# Patient Record
Sex: Female | Born: 1935 | Race: White | Hispanic: No | Smoking: Never smoker
Health system: Southern US, Community
[De-identification: ages and names within clinical notes are randomized; demographics above are authoritative.]

## PROBLEM LIST (undated history)

## (undated) DIAGNOSIS — G43909 Migraine, unspecified, not intractable, without status migrainosus: Secondary | ICD-10-CM

## (undated) HISTORY — DX: Migraine, unspecified, not intractable, without status migrainosus: G43.909

---

## 1999-06-10 ENCOUNTER — Encounter: Payer: Self-pay | Admitting: Family Medicine

## 1999-06-10 ENCOUNTER — Encounter: Admission: RE | Admit: 1999-06-10 | Discharge: 1999-06-10 | Payer: Self-pay | Admitting: Family Medicine

## 1999-07-02 ENCOUNTER — Encounter: Admission: RE | Admit: 1999-07-02 | Discharge: 1999-07-02 | Payer: Self-pay | Admitting: Family Medicine

## 1999-07-02 ENCOUNTER — Encounter: Payer: Self-pay | Admitting: Family Medicine

## 2000-01-14 ENCOUNTER — Encounter: Admission: RE | Admit: 2000-01-14 | Discharge: 2000-01-14 | Payer: Self-pay | Admitting: Family Medicine

## 2000-01-14 ENCOUNTER — Encounter: Payer: Self-pay | Admitting: Family Medicine

## 2000-09-09 ENCOUNTER — Encounter: Payer: Self-pay | Admitting: Family Medicine

## 2000-09-09 ENCOUNTER — Encounter: Admission: RE | Admit: 2000-09-09 | Discharge: 2000-09-09 | Payer: Self-pay | Admitting: Family Medicine

## 2001-04-04 ENCOUNTER — Encounter: Payer: Self-pay | Admitting: Family Medicine

## 2001-04-04 ENCOUNTER — Encounter: Admission: RE | Admit: 2001-04-04 | Discharge: 2001-04-04 | Payer: Self-pay | Admitting: Family Medicine

## 2002-06-26 ENCOUNTER — Encounter: Payer: Self-pay | Admitting: Family Medicine

## 2002-06-26 ENCOUNTER — Encounter: Admission: RE | Admit: 2002-06-26 | Discharge: 2002-06-26 | Payer: Self-pay | Admitting: Family Medicine

## 2003-07-09 ENCOUNTER — Ambulatory Visit (HOSPITAL_COMMUNITY): Admission: RE | Admit: 2003-07-09 | Discharge: 2003-07-09 | Payer: Self-pay | Admitting: Family Medicine

## 2005-02-03 ENCOUNTER — Ambulatory Visit (HOSPITAL_COMMUNITY): Admission: RE | Admit: 2005-02-03 | Discharge: 2005-02-03 | Payer: Self-pay | Admitting: Family Medicine

## 2006-07-05 ENCOUNTER — Ambulatory Visit (HOSPITAL_COMMUNITY): Admission: RE | Admit: 2006-07-05 | Discharge: 2006-07-05 | Payer: Self-pay | Admitting: Family Medicine

## 2006-12-27 ENCOUNTER — Other Ambulatory Visit: Admission: RE | Admit: 2006-12-27 | Discharge: 2006-12-27 | Payer: Self-pay | Admitting: Family Medicine

## 2007-08-09 ENCOUNTER — Ambulatory Visit (HOSPITAL_COMMUNITY): Admission: RE | Admit: 2007-08-09 | Discharge: 2007-08-09 | Payer: Self-pay | Admitting: Family Medicine

## 2008-08-22 ENCOUNTER — Ambulatory Visit (HOSPITAL_COMMUNITY): Admission: RE | Admit: 2008-08-22 | Discharge: 2008-08-22 | Payer: Self-pay | Admitting: Family Medicine

## 2009-08-27 ENCOUNTER — Ambulatory Visit (HOSPITAL_COMMUNITY): Admission: RE | Admit: 2009-08-27 | Discharge: 2009-08-27 | Payer: Self-pay | Admitting: Family Medicine

## 2010-10-01 ENCOUNTER — Other Ambulatory Visit (HOSPITAL_COMMUNITY): Payer: Self-pay | Admitting: Family Medicine

## 2010-10-01 DIAGNOSIS — Z1231 Encounter for screening mammogram for malignant neoplasm of breast: Secondary | ICD-10-CM

## 2010-10-14 ENCOUNTER — Ambulatory Visit (HOSPITAL_COMMUNITY)
Admission: RE | Admit: 2010-10-14 | Discharge: 2010-10-14 | Disposition: A | Discharge status: Home / Self Care | Payer: PRIVATE HEALTH INSURANCE | Source: Ambulatory Visit | Attending: Family Medicine | Admitting: Family Medicine

## 2010-10-14 DIAGNOSIS — Z1231 Encounter for screening mammogram for malignant neoplasm of breast: Secondary | ICD-10-CM | POA: Insufficient documentation

## 2011-11-16 ENCOUNTER — Other Ambulatory Visit (HOSPITAL_COMMUNITY): Payer: Self-pay | Admitting: Family Medicine

## 2011-11-16 DIAGNOSIS — Z1231 Encounter for screening mammogram for malignant neoplasm of breast: Secondary | ICD-10-CM

## 2011-12-01 ENCOUNTER — Ambulatory Visit (HOSPITAL_COMMUNITY)
Admission: RE | Admit: 2011-12-01 | Discharge: 2011-12-01 | Disposition: A | Discharge status: Home / Self Care | Payer: Medicare Other | Source: Ambulatory Visit | Attending: Family Medicine | Admitting: Family Medicine

## 2011-12-01 DIAGNOSIS — Z1231 Encounter for screening mammogram for malignant neoplasm of breast: Secondary | ICD-10-CM | POA: Insufficient documentation

## 2011-12-01 IMAGING — MG MM DIGITAL SCREENING BILAT
5 series · 5 of 5 positions shown · non-contrast
Comparison: Previous exams.

CLINICAL DATA: Screening.

DIGITAL BILATERAL SCREENING MAMMOGRAM WITH CAD

[R CC]
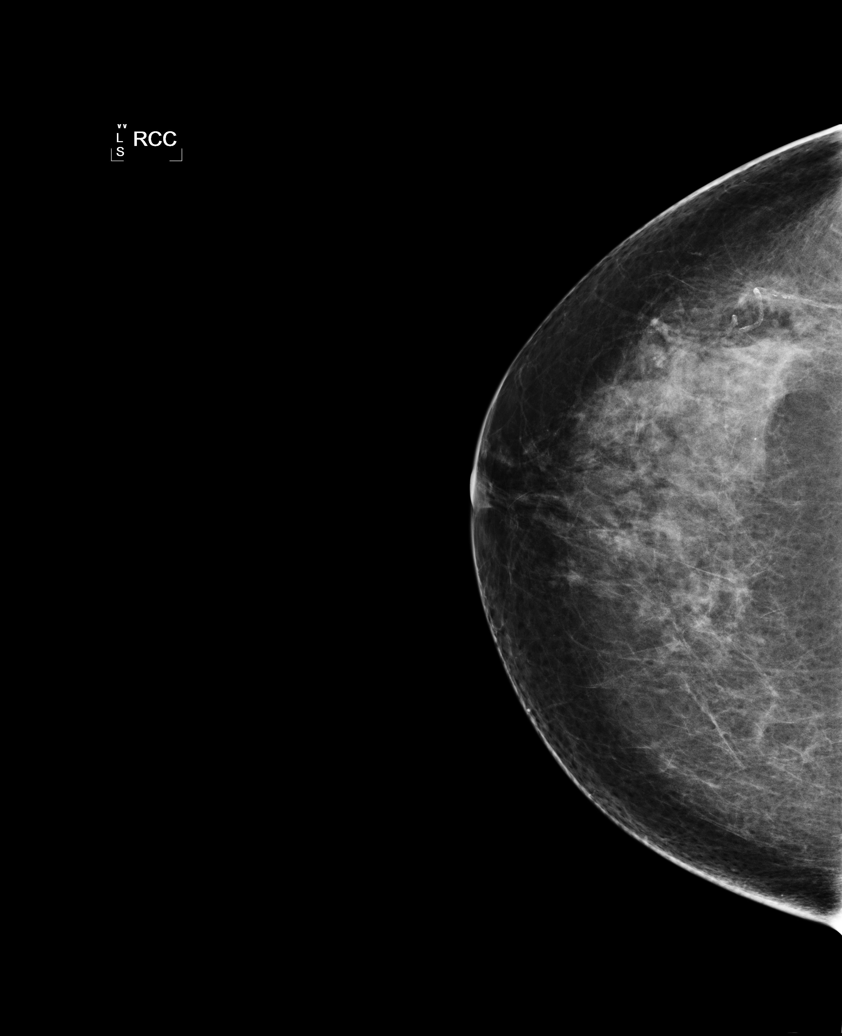

[R MLO (1 of 2)]
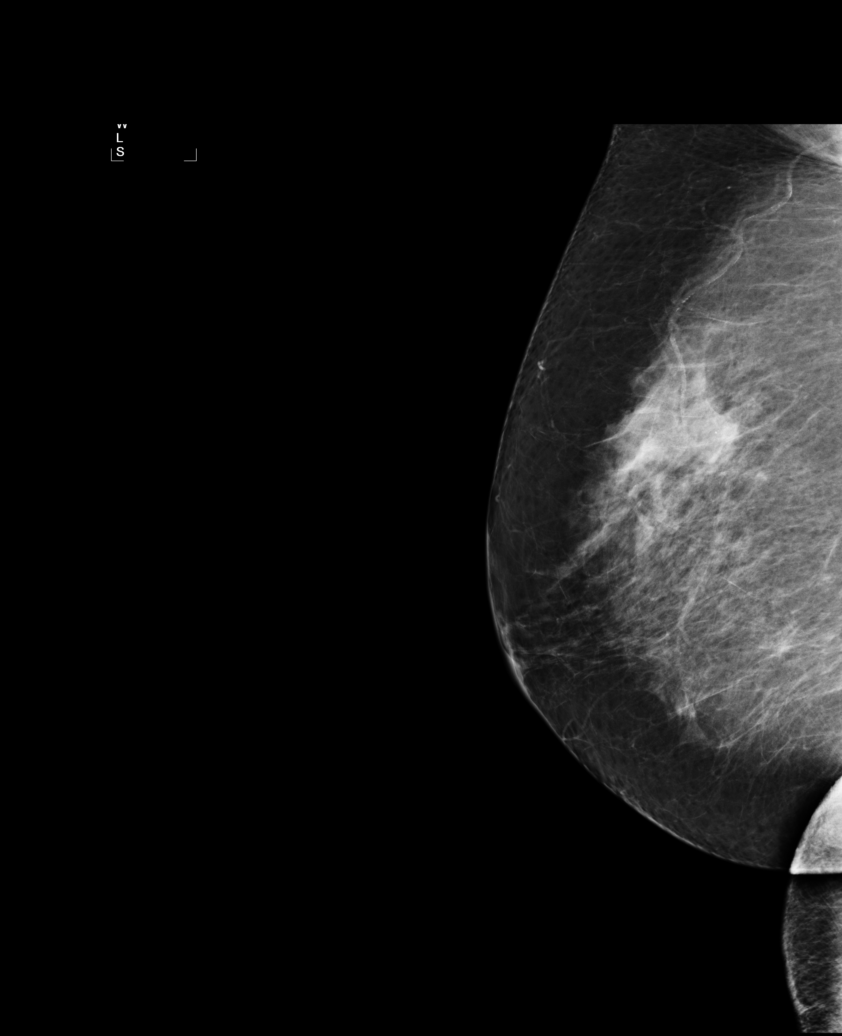

[L CC]
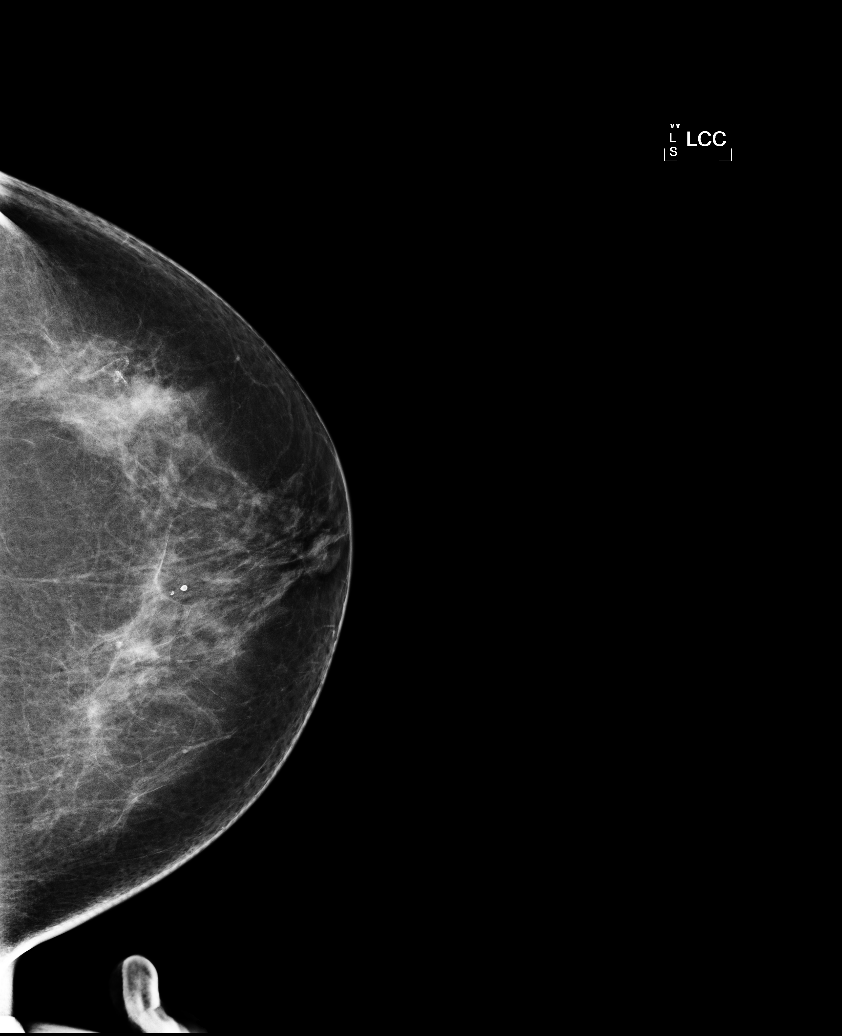

[L MLO]
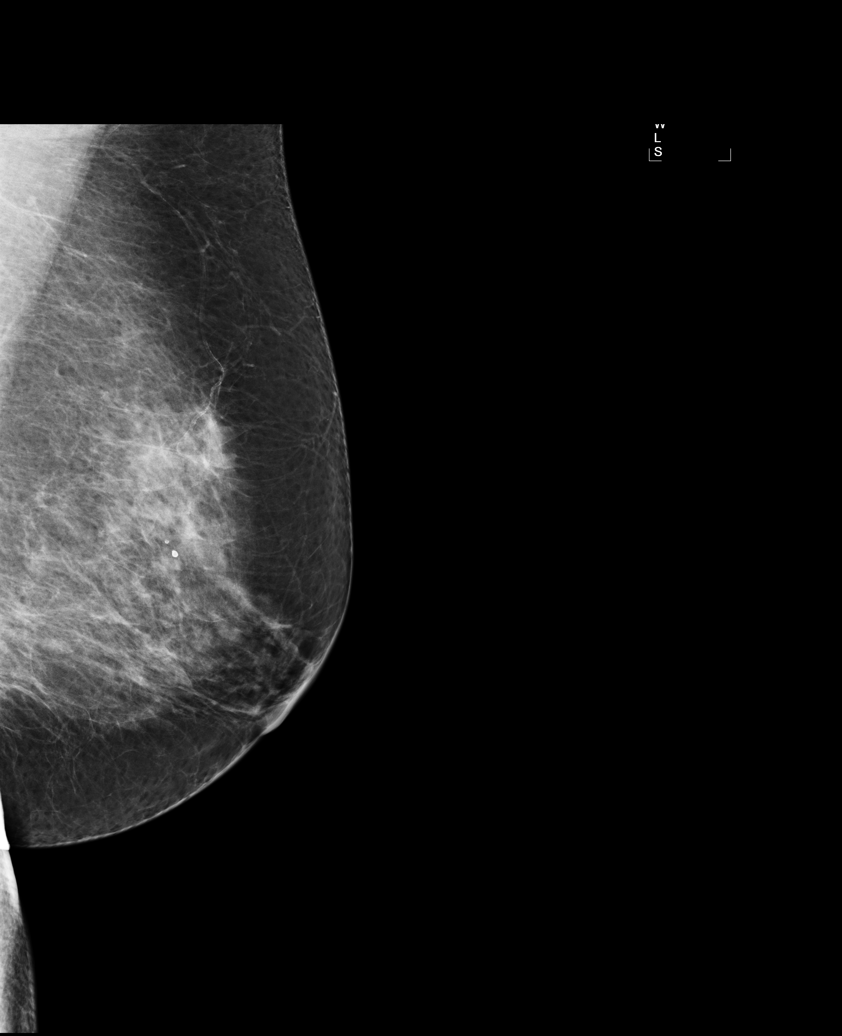

[R MLO (2 of 2)]
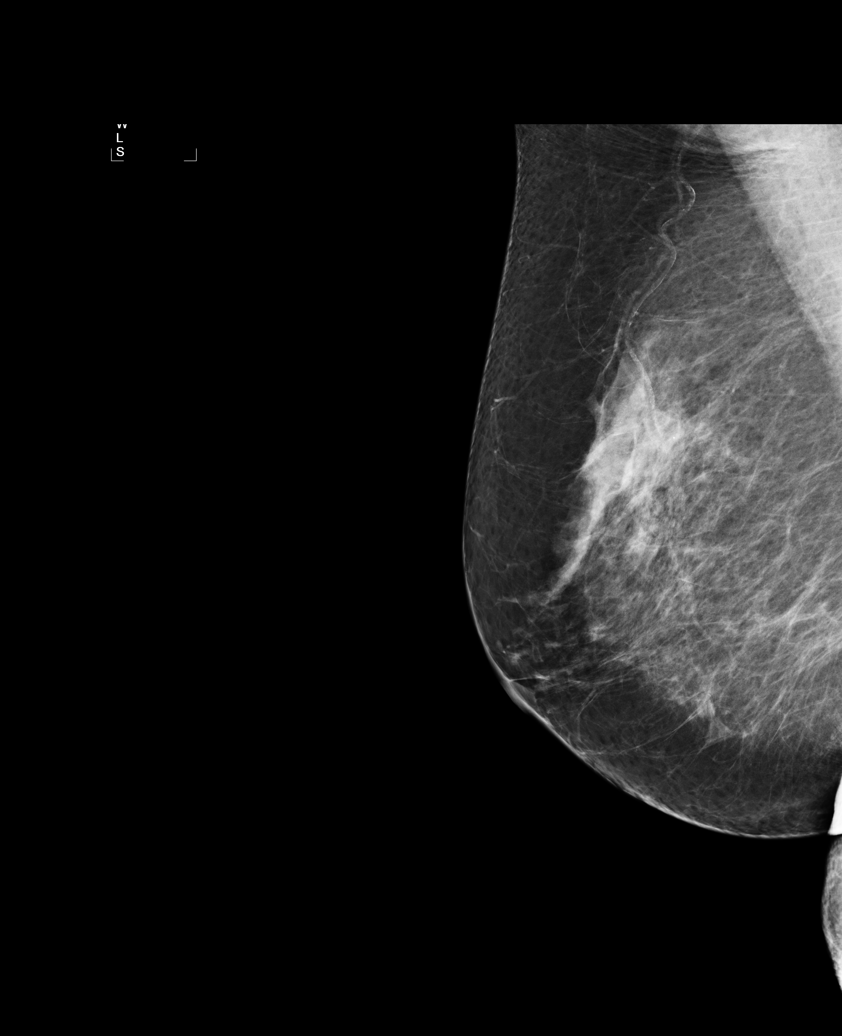

[5 of 5 positions shown; findings below may reference images not displayed]

FINDINGS: There are scattered fibroglandular densities. No
suspicious masses, architectural distortion, or calcifications are
present.

Images were processed with CAD.
IMPRESSION: No mammographic evidence of malignancy.

A result letter of this screening mammogram will be mailed directly
to the patient.

RECOMMENDATION:
Screening mammogram in one year. (Code:[XG])

BI-RADS CATEGORY 1:  Negative.

## 2012-09-01 ENCOUNTER — Ambulatory Visit: Payer: Medicare Other | Attending: Family Medicine | Admitting: Rehabilitative and Restorative Service Providers"

## 2012-09-01 DIAGNOSIS — R42 Dizziness and giddiness: Secondary | ICD-10-CM | POA: Insufficient documentation

## 2012-09-01 DIAGNOSIS — IMO0001 Reserved for inherently not codable concepts without codable children: Secondary | ICD-10-CM | POA: Insufficient documentation

## 2012-09-01 DIAGNOSIS — R269 Unspecified abnormalities of gait and mobility: Secondary | ICD-10-CM | POA: Insufficient documentation

## 2012-09-06 ENCOUNTER — Ambulatory Visit: Payer: Medicare Other | Attending: Family Medicine | Admitting: Rehabilitative and Restorative Service Providers"

## 2012-09-06 DIAGNOSIS — R269 Unspecified abnormalities of gait and mobility: Secondary | ICD-10-CM | POA: Insufficient documentation

## 2012-09-06 DIAGNOSIS — IMO0001 Reserved for inherently not codable concepts without codable children: Secondary | ICD-10-CM | POA: Insufficient documentation

## 2012-09-06 DIAGNOSIS — R42 Dizziness and giddiness: Secondary | ICD-10-CM | POA: Insufficient documentation

## 2012-09-13 ENCOUNTER — Encounter: Payer: PRIVATE HEALTH INSURANCE | Admitting: Rehabilitative and Restorative Service Providers"

## 2013-03-21 ENCOUNTER — Other Ambulatory Visit (HOSPITAL_COMMUNITY): Payer: Self-pay | Admitting: Family Medicine

## 2013-03-21 DIAGNOSIS — Z1231 Encounter for screening mammogram for malignant neoplasm of breast: Secondary | ICD-10-CM

## 2013-03-22 ENCOUNTER — Ambulatory Visit (HOSPITAL_COMMUNITY)
Admission: RE | Admit: 2013-03-22 | Discharge: 2013-03-22 | Disposition: A | Discharge status: Home / Self Care | Payer: Medicare Other | Source: Ambulatory Visit | Attending: Family Medicine | Admitting: Family Medicine

## 2013-03-22 DIAGNOSIS — Z1231 Encounter for screening mammogram for malignant neoplasm of breast: Secondary | ICD-10-CM | POA: Insufficient documentation

## 2013-03-22 IMAGING — MG MM DIGITAL BREAST 3D TOMOSYNTHESIS
9 series · 9 of 25 positions shown · non-contrast
Comparison: Previous exam(s).

CLINICAL DATA: Screening.

EXAM:
DIGITAL SCREENING BILATERAL MAMMOGRAM WITH 3D TOMO WITH CAD

[R MLO (1 of 2)]
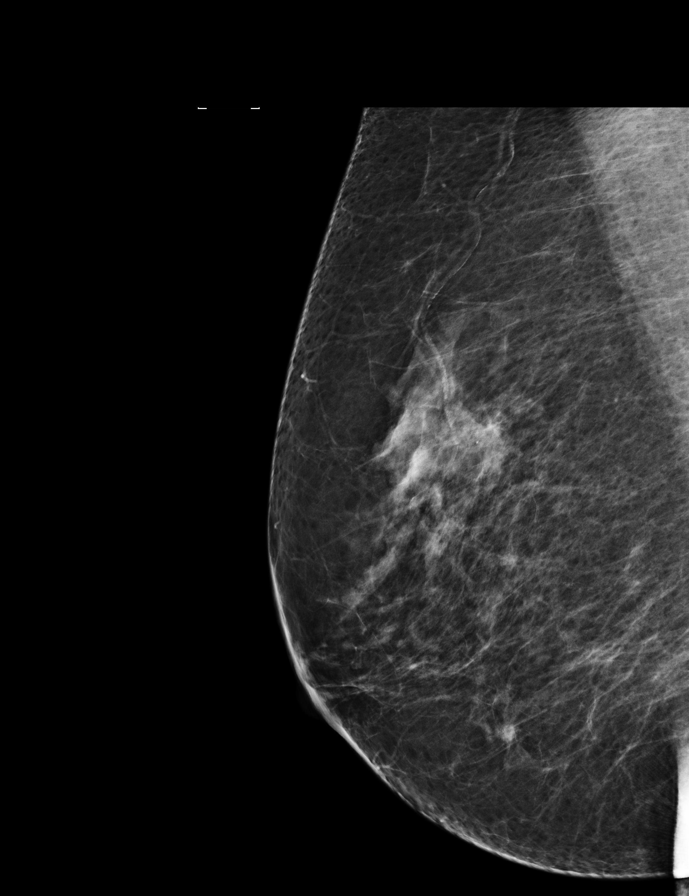

[L CC]
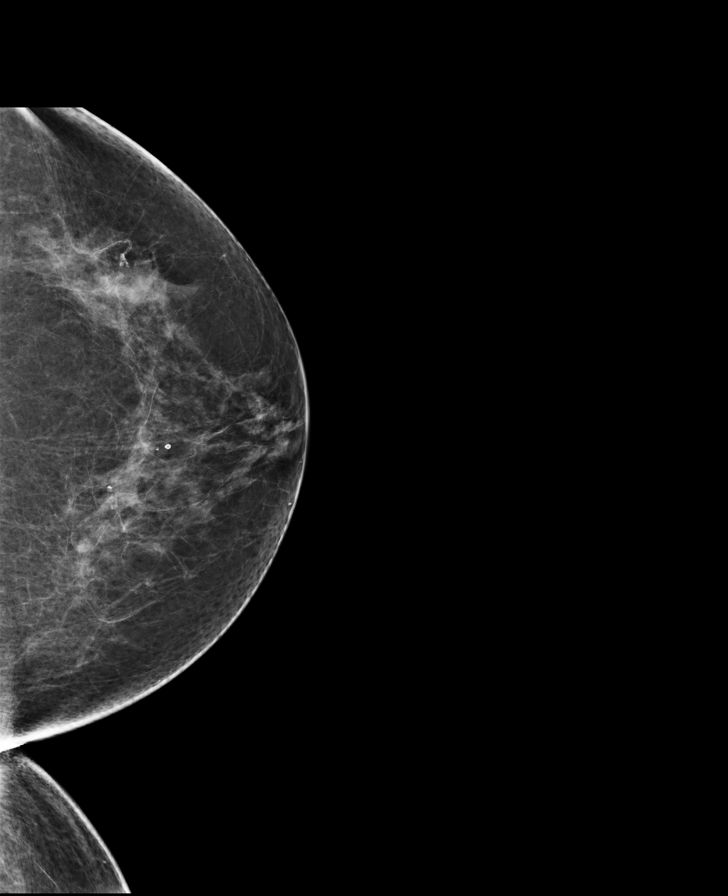

[R CC]
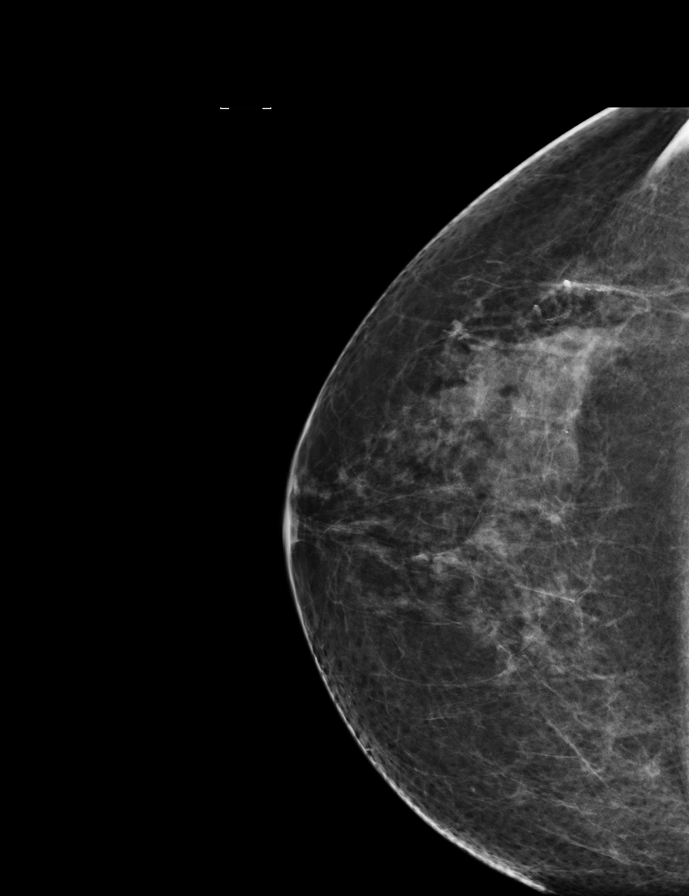

[L MLO]
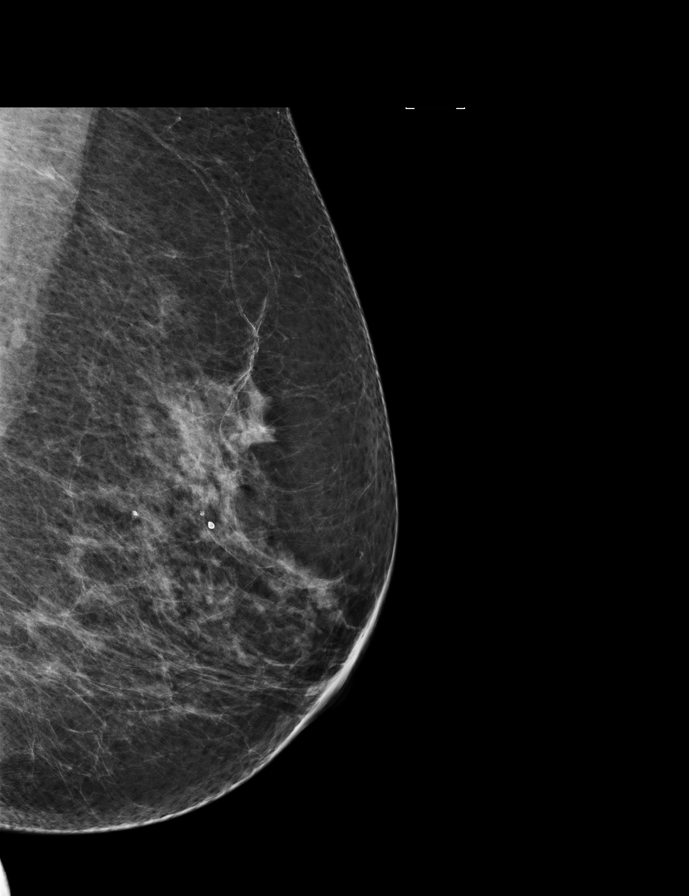

[R MLO (2 of 2)]
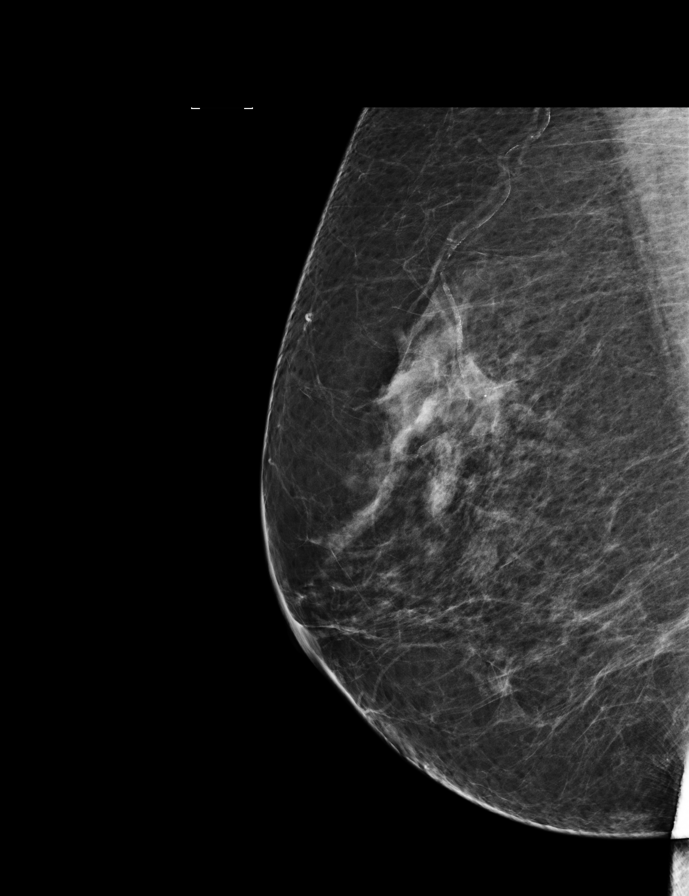

[L CC tomo · tomo slice 46/91.0]
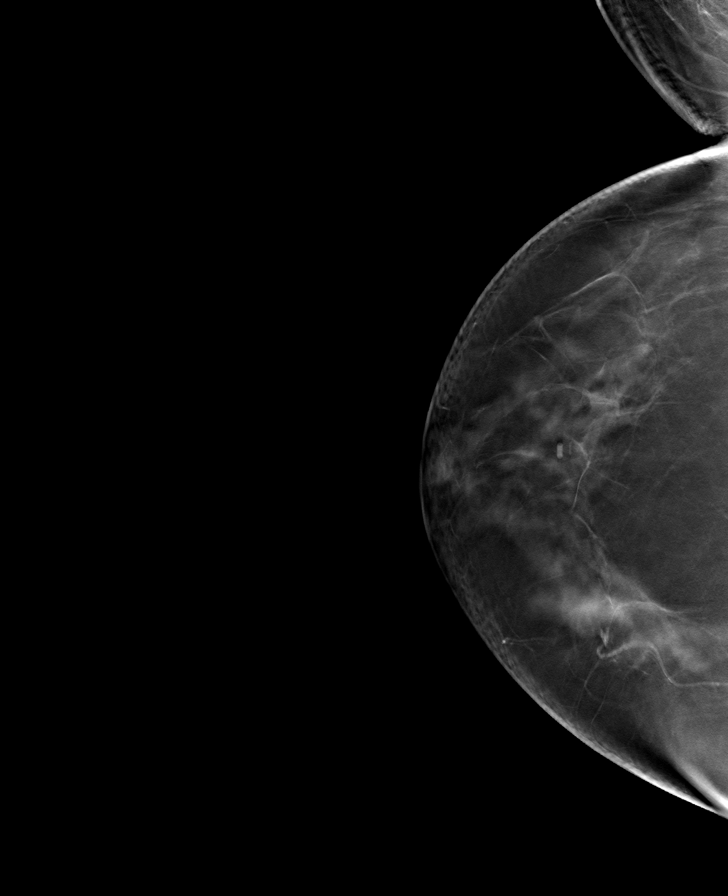

[R CC tomo · tomo slice 47/93.0]
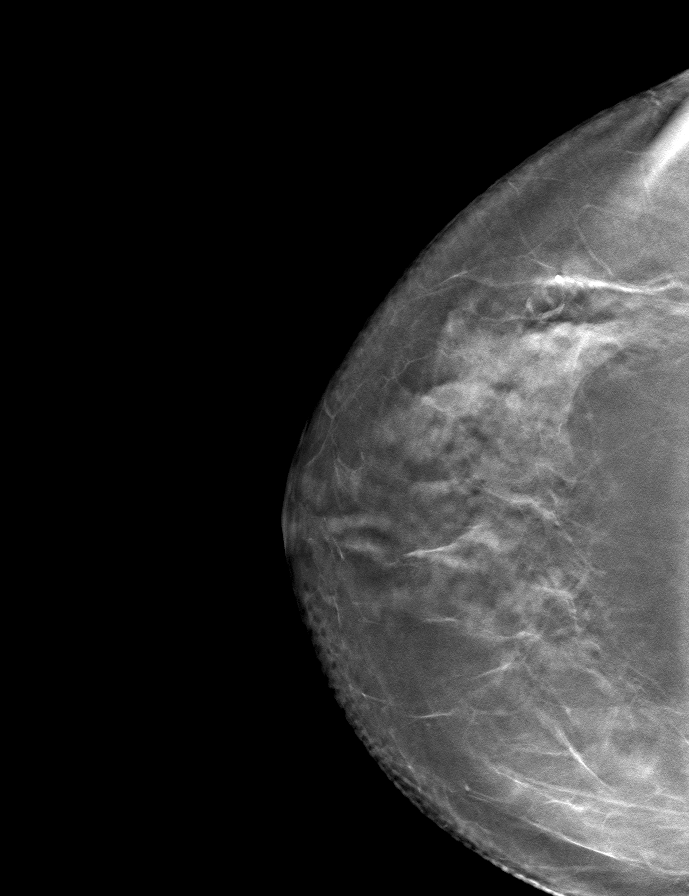

[R MLO tomo · tomo slice 46/91.0]
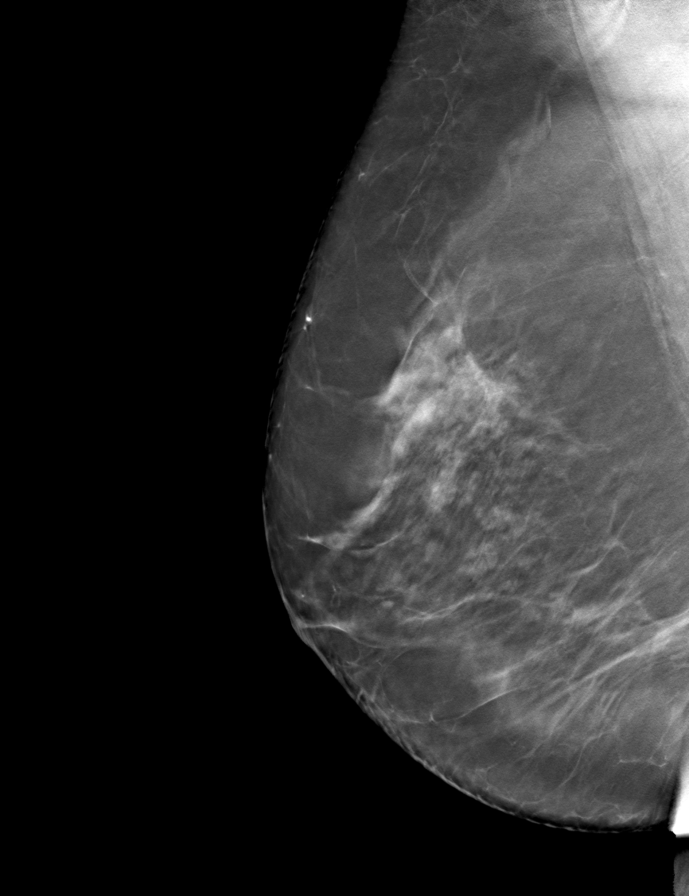

[L MLO tomo · tomo slice 45/88.0]
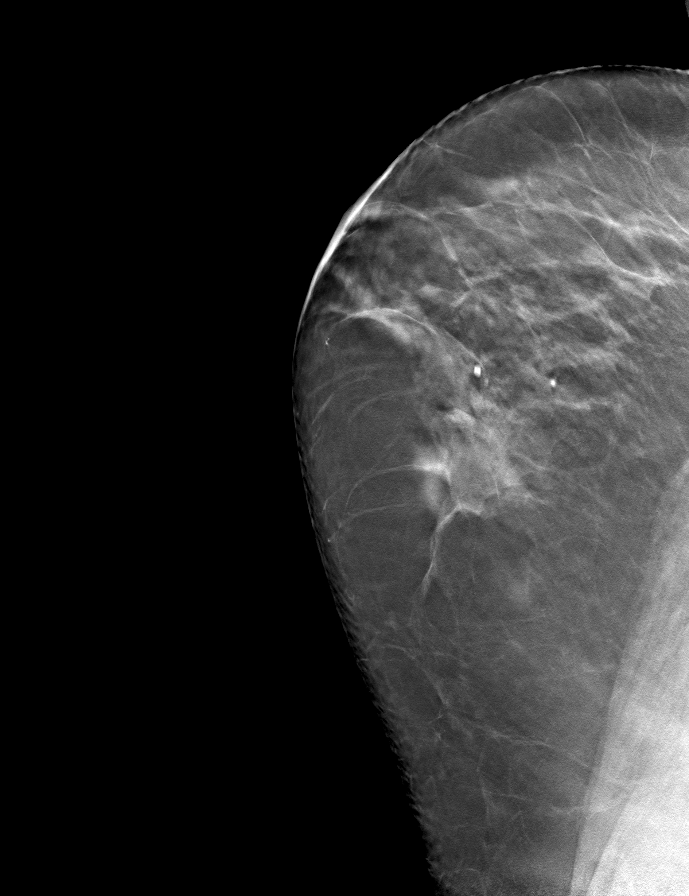

[9 of 25 positions shown; findings below may reference images not displayed]

ACR Breast Density Category b: There are scattered areas of
fibroglandular density.
FINDINGS: There are no findings suspicious for malignancy. Images were
processed with CAD.
IMPRESSION: No mammographic evidence of malignancy. A result letter of this
screening mammogram will be mailed directly to the patient.

RECOMMENDATION:
Screening mammogram in one year. (Code:[8N])

BI-RADS CATEGORY  1: Negative

## 2014-06-04 ENCOUNTER — Other Ambulatory Visit (HOSPITAL_COMMUNITY): Payer: Self-pay | Admitting: Family Medicine

## 2014-06-04 DIAGNOSIS — Z1231 Encounter for screening mammogram for malignant neoplasm of breast: Secondary | ICD-10-CM

## 2014-06-12 ENCOUNTER — Ambulatory Visit (HOSPITAL_COMMUNITY): Payer: PRIVATE HEALTH INSURANCE

## 2014-06-14 ENCOUNTER — Ambulatory Visit (HOSPITAL_COMMUNITY)
Admission: RE | Admit: 2014-06-14 | Discharge: 2014-06-14 | Disposition: A | Discharge status: Home / Self Care | Payer: Medicare Other | Source: Ambulatory Visit | Attending: Family Medicine | Admitting: Family Medicine

## 2014-06-14 DIAGNOSIS — Z1231 Encounter for screening mammogram for malignant neoplasm of breast: Secondary | ICD-10-CM | POA: Insufficient documentation

## 2014-06-14 IMAGING — MG MM SCREENING BREAST TOMO BILATERAL
8 series · 8 of 24 positions shown · non-contrast
Comparison: Previous exam(s).

CLINICAL DATA: Screening.

EXAM:
DIGITAL SCREENING BILATERAL MAMMOGRAM WITH 3D TOMO WITH CAD

[R CC]
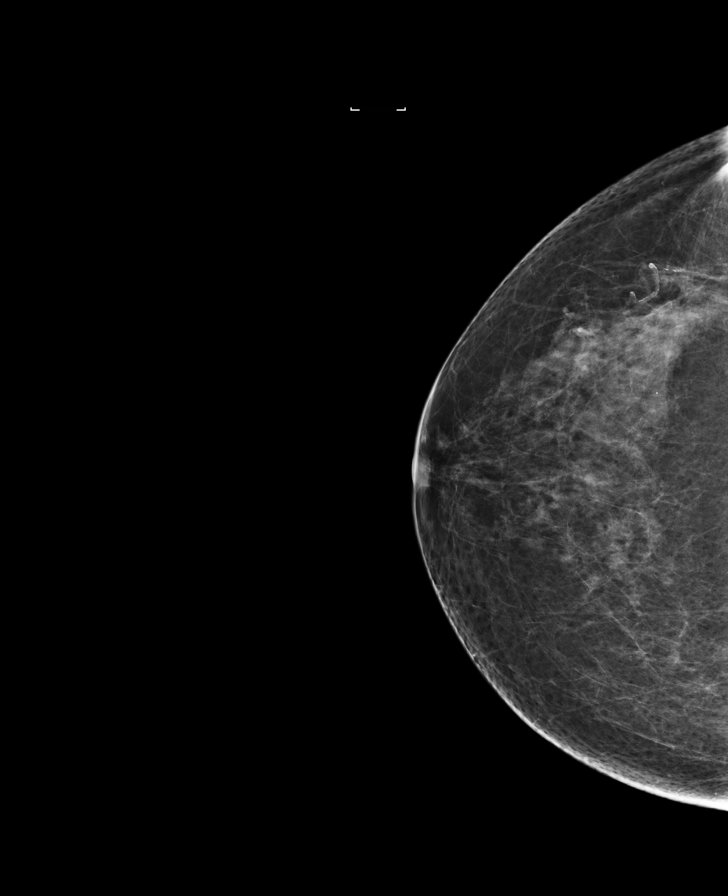

[R MLO]
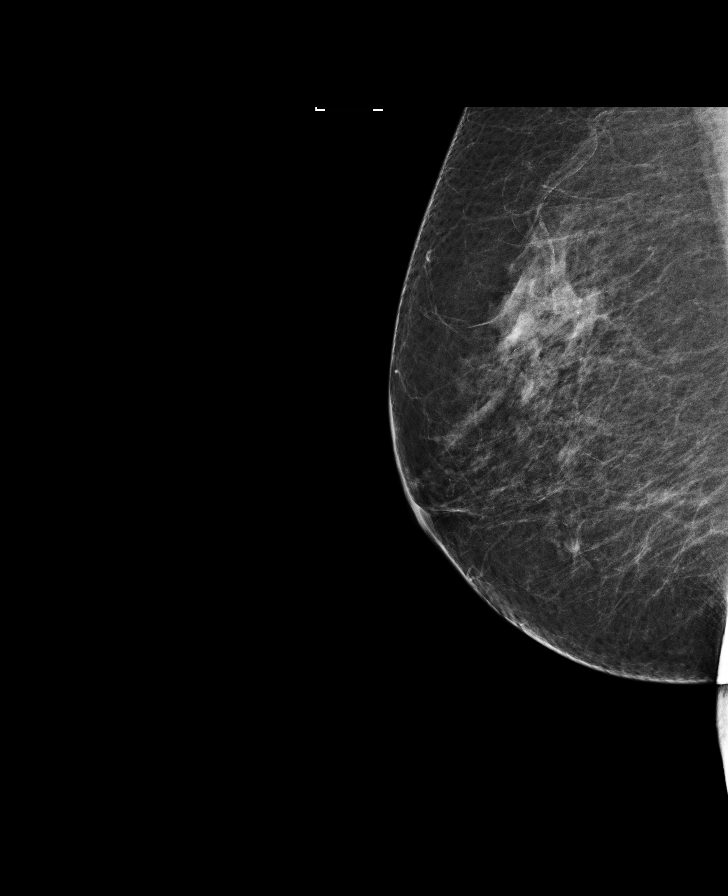

[L CC]
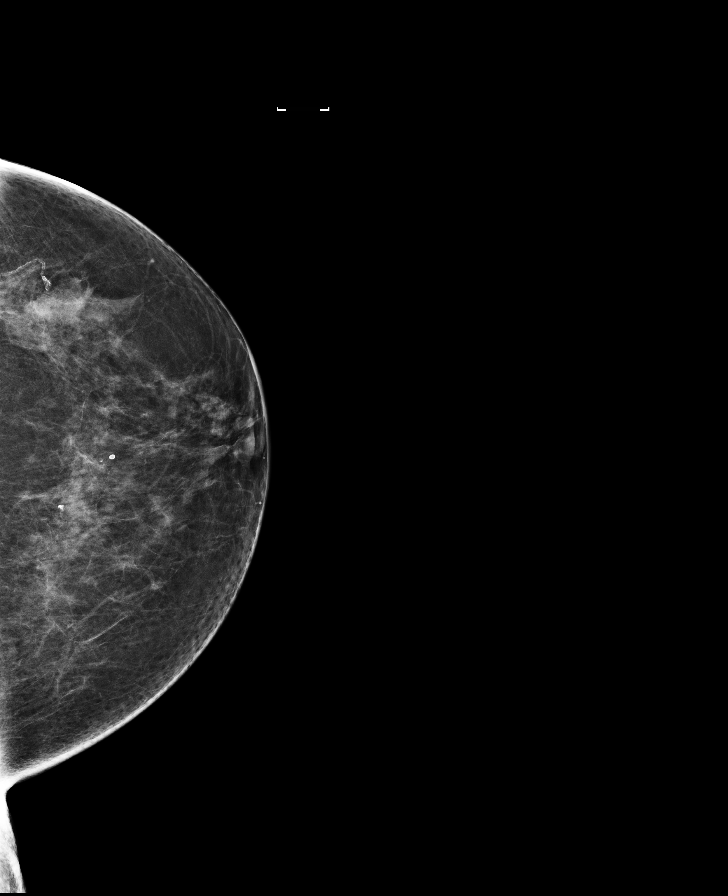

[L MLO]
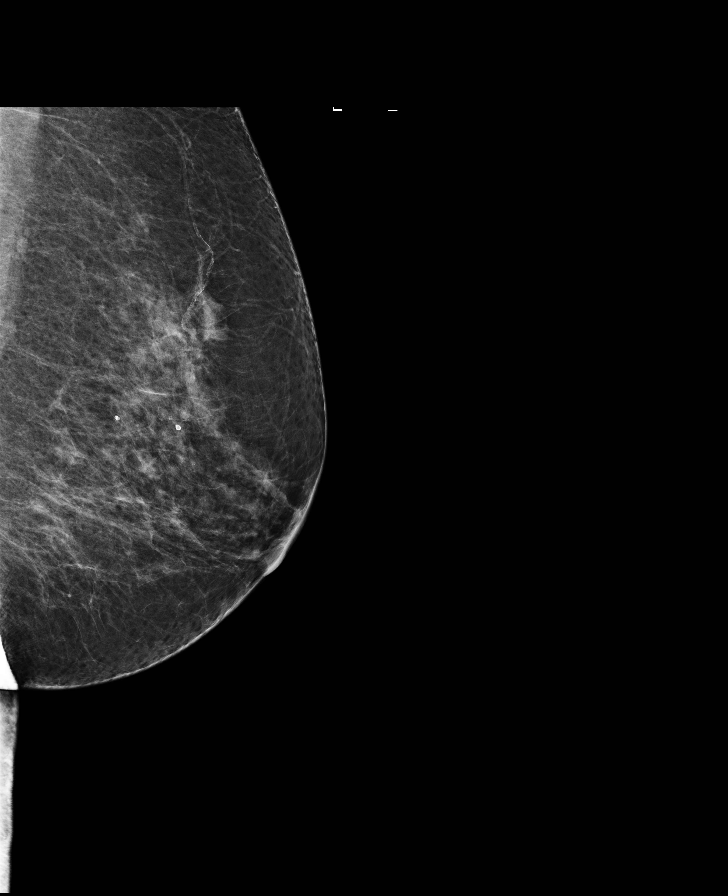

[R MLO tomo · tomo slice 49/96.0]
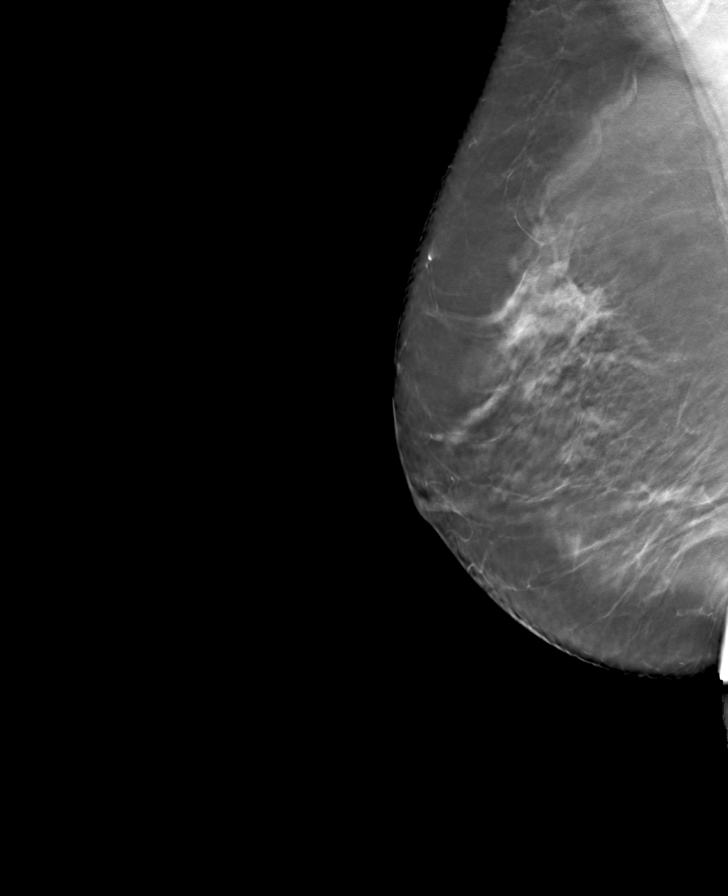

[L MLO tomo · tomo slice 47/93.0]
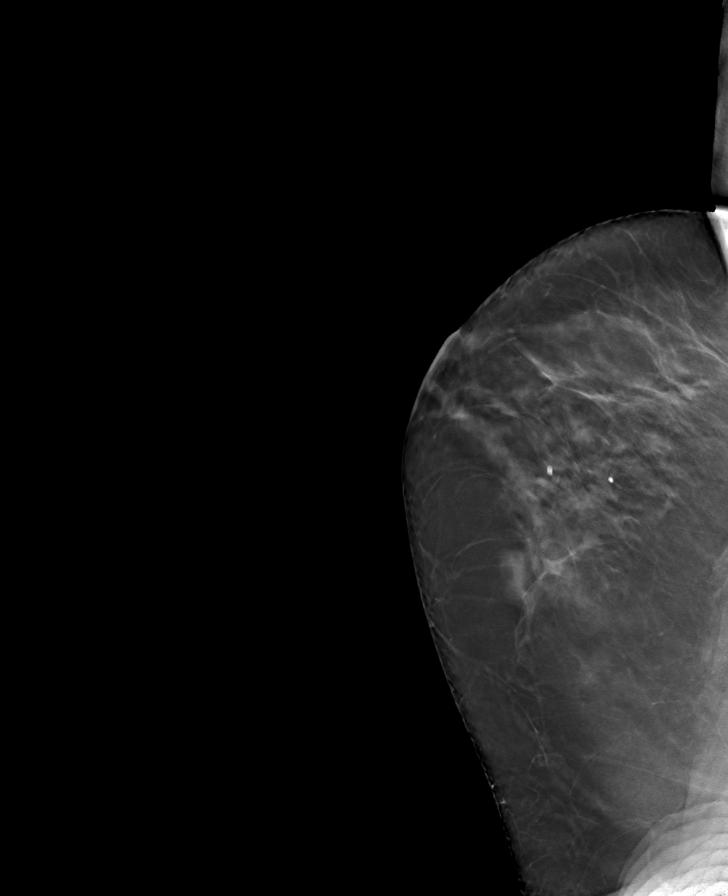

[L CC tomo · tomo slice 43/85.0]
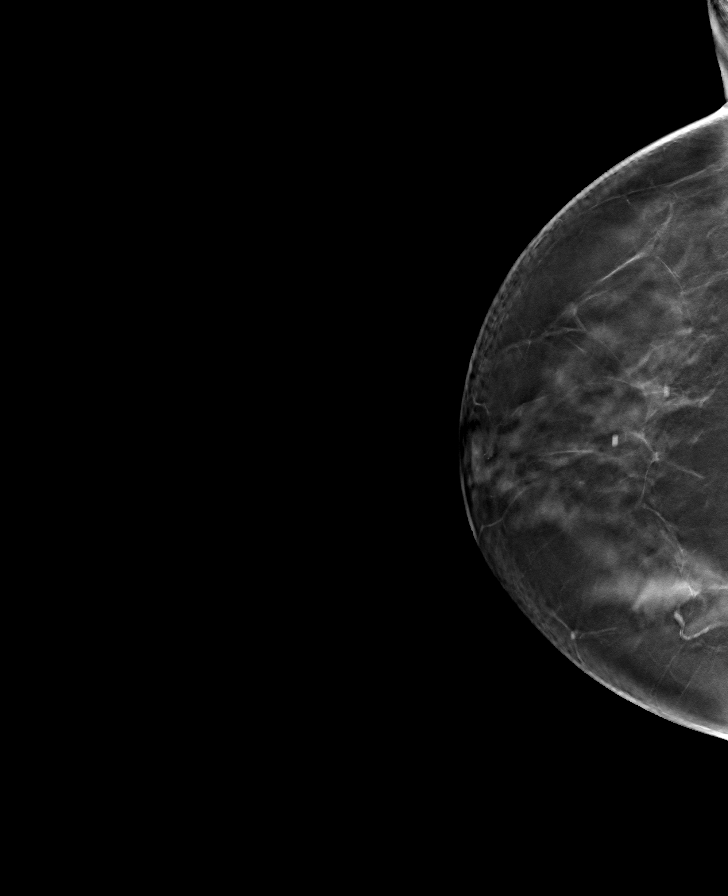

[R CC tomo · tomo slice 47/93.0]
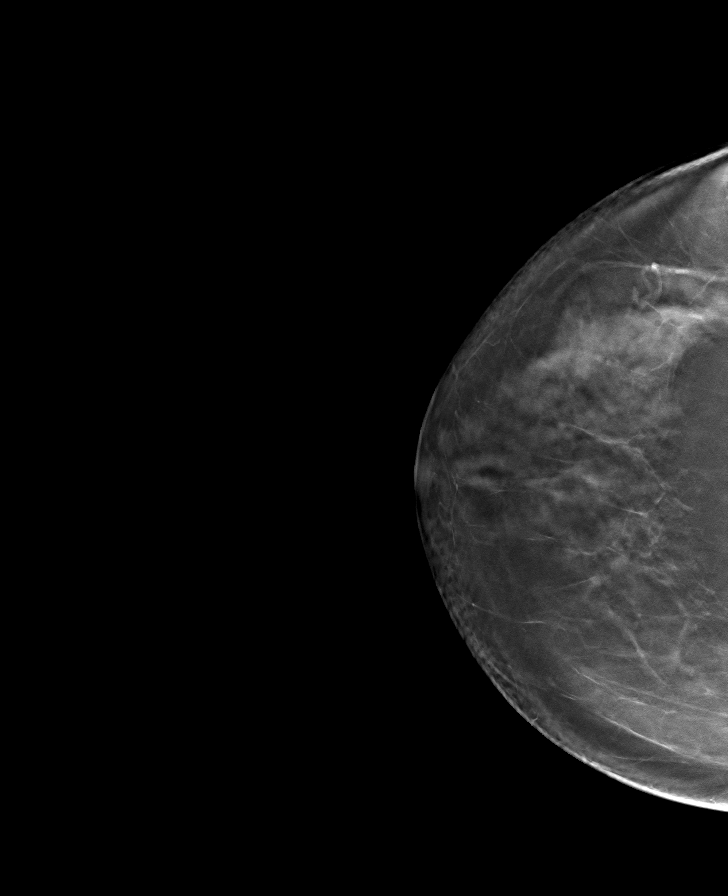

[8 of 24 positions shown; findings below may reference images not displayed]

ACR Breast Density Category c: The breast tissue is heterogeneously
dense, which may obscure small masses.
FINDINGS: There are no findings suspicious for malignancy. Images were
processed with CAD.
IMPRESSION: No mammographic evidence of malignancy. A result letter of this
screening mammogram will be mailed directly to the patient.

RECOMMENDATION:
Screening mammogram in one year. (Code:[SM])

BI-RADS CATEGORY  1: Negative.

## 2016-01-02 ENCOUNTER — Other Ambulatory Visit: Payer: Self-pay | Admitting: Family Medicine

## 2016-01-02 DIAGNOSIS — Z1231 Encounter for screening mammogram for malignant neoplasm of breast: Secondary | ICD-10-CM

## 2016-01-14 ENCOUNTER — Ambulatory Visit
Admission: RE | Admit: 2016-01-14 | Discharge: 2016-01-14 | Disposition: A | Discharge status: Home / Self Care | Payer: Medicare Other | Source: Ambulatory Visit | Attending: Family Medicine | Admitting: Family Medicine

## 2016-01-14 DIAGNOSIS — Z1231 Encounter for screening mammogram for malignant neoplasm of breast: Secondary | ICD-10-CM

## 2016-01-14 IMAGING — MG 2D DIGITAL SCREENING BILATERAL MAMMOGRAM WITH CAD AND ADJUNCT TO
9 of 11 series · 9 of 23 positions shown · non-contrast
Comparison: Previous exam(s).

CLINICAL DATA: Screening.

EXAM:
2D DIGITAL SCREENING BILATERAL MAMMOGRAM WITH CAD AND ADJUNCT TOMO

[R MLO]
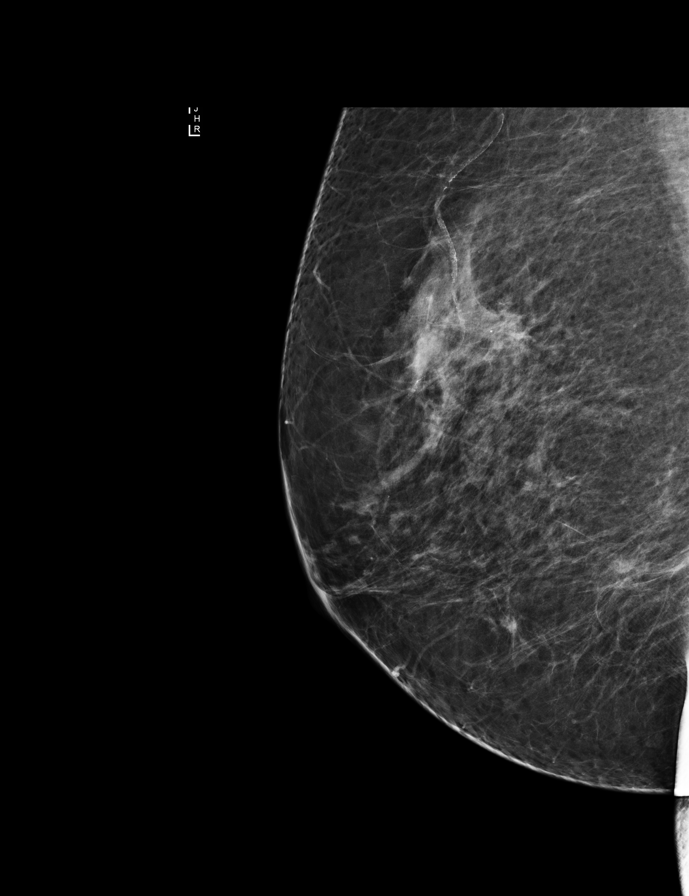

[L MLO synth-2D]
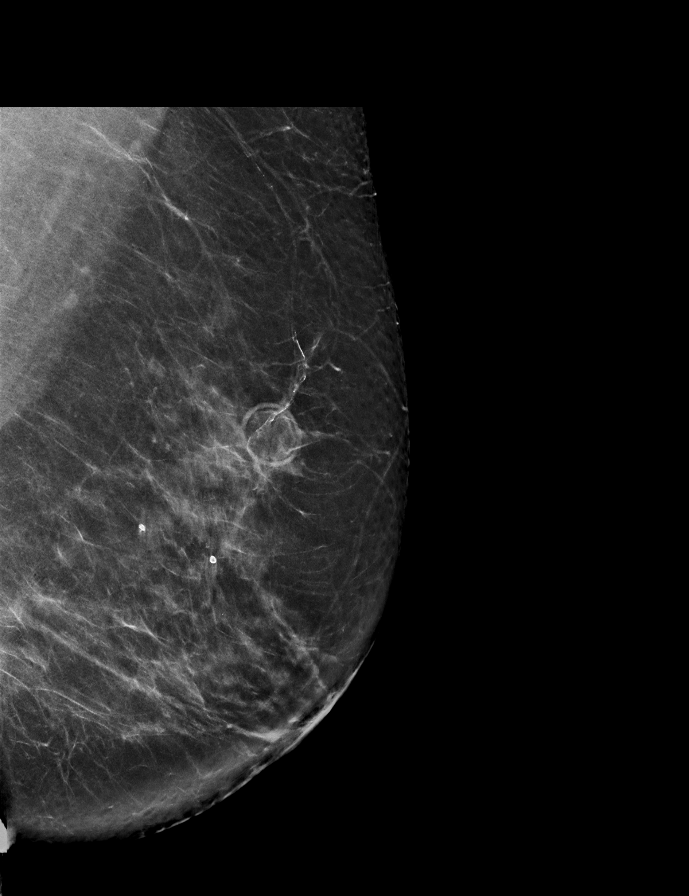

[L CC synth-2D]
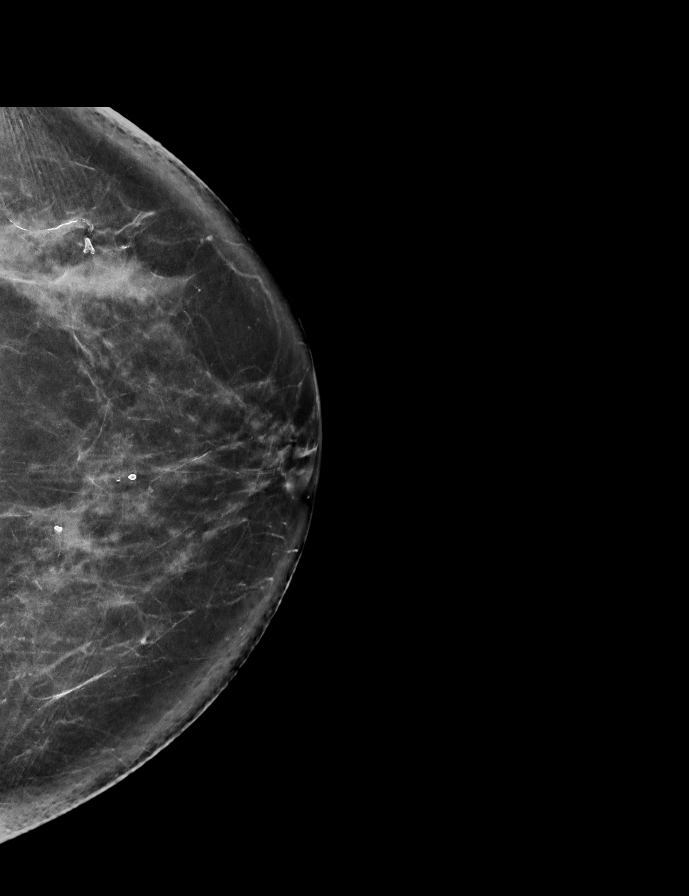

[R MLO synth-2D]
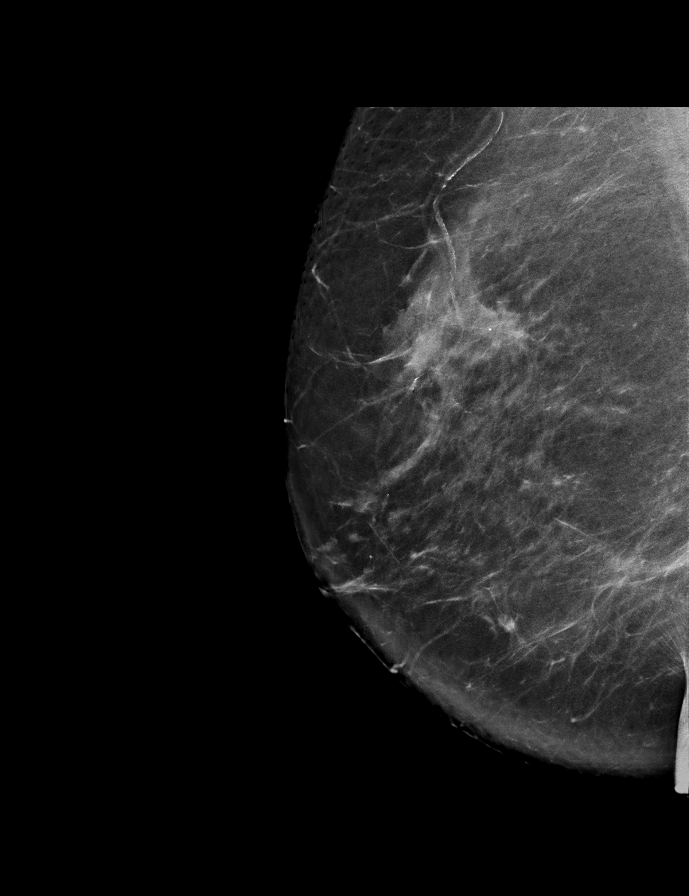

[R CC]
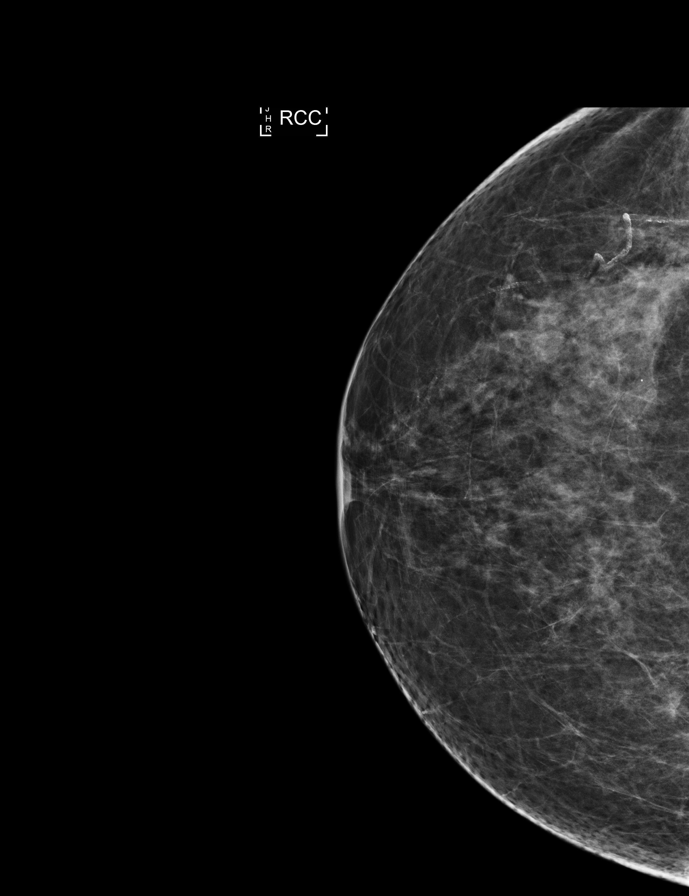

[L MLO]
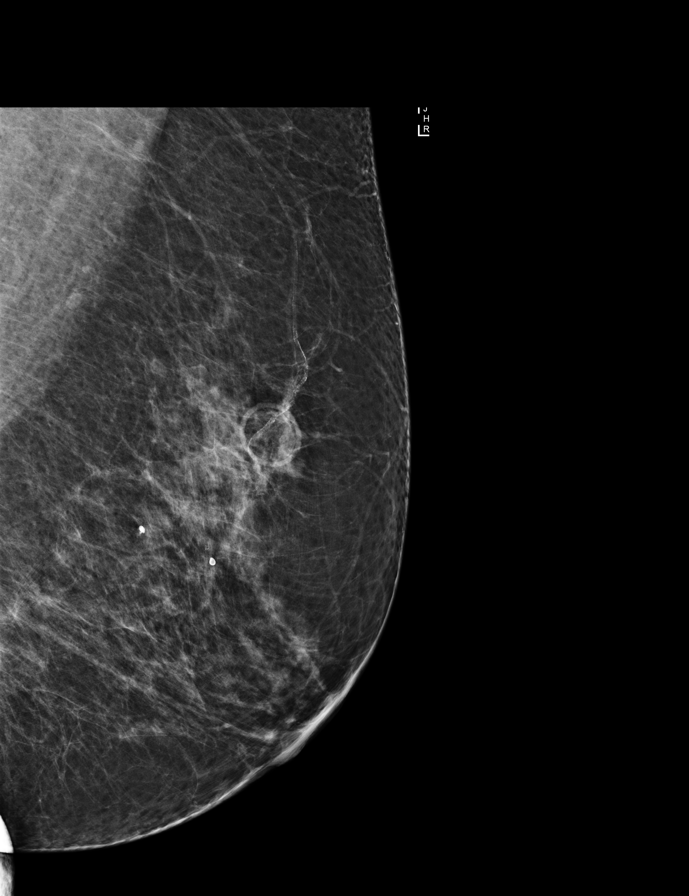

[L CC]
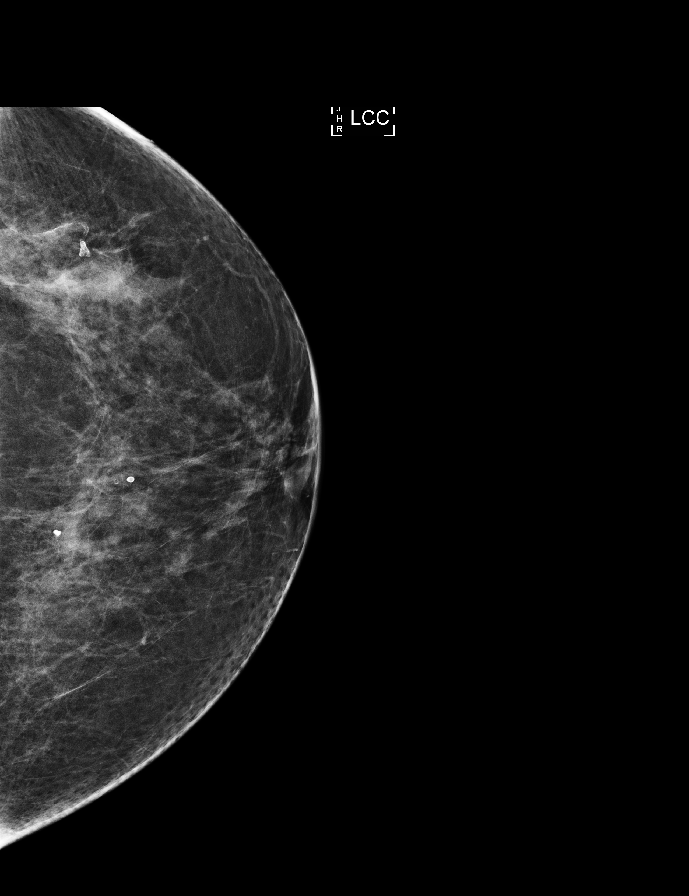

[R CC synth-2D]
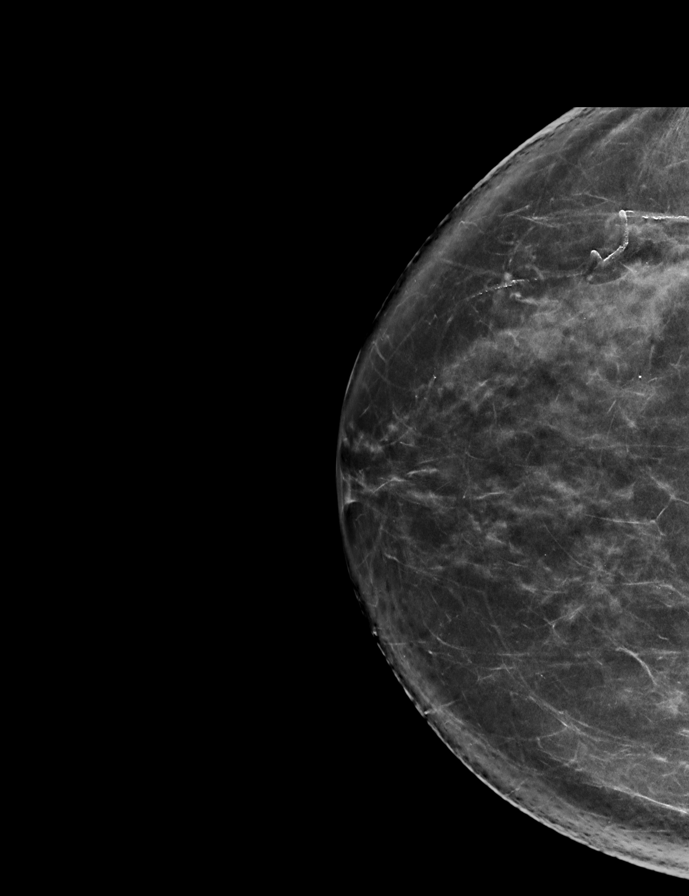

[L MLO tomo · tomo slice 44/87.0]
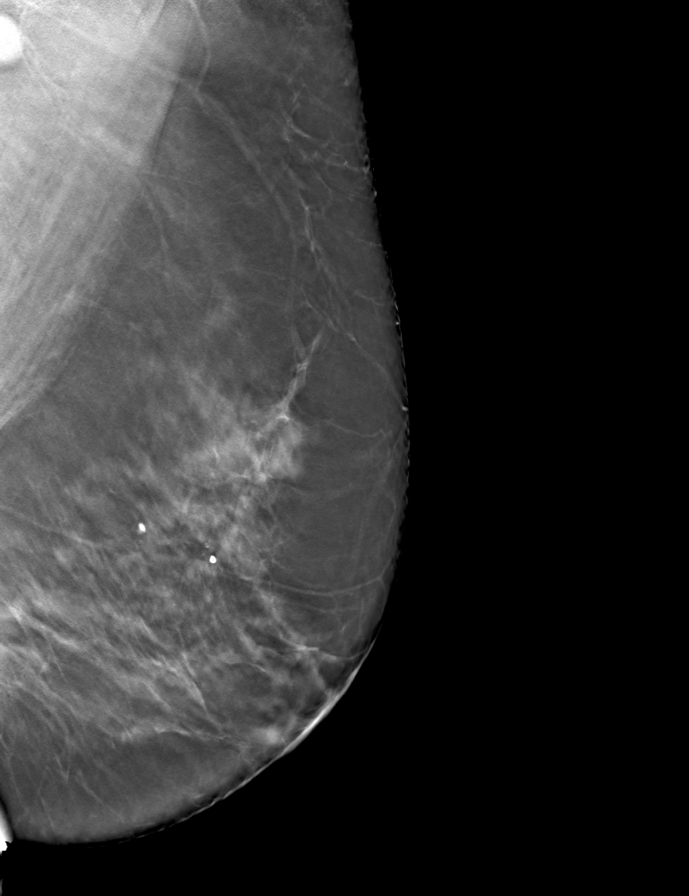

[9 of 23 positions shown; findings below may reference images not displayed]

ACR Breast Density Category c: The breast tissue is heterogeneously
dense, which may obscure small masses.
FINDINGS: There are no findings suspicious for malignancy. Images were
processed with CAD.
IMPRESSION: No mammographic evidence of malignancy. A result letter of this
screening mammogram will be mailed directly to the patient.

RECOMMENDATION:
Screening mammogram in one year. (Code:[TA])

BI-RADS CATEGORY  1: Negative.

## 2016-03-19 ENCOUNTER — Ambulatory Visit
Admission: RE | Admit: 2016-03-19 | Discharge: 2016-03-19 | Disposition: A | Discharge status: Home / Self Care | Payer: Medicare Other | Source: Ambulatory Visit | Attending: Family Medicine | Admitting: Family Medicine

## 2016-03-19 ENCOUNTER — Other Ambulatory Visit: Payer: Self-pay | Admitting: Family Medicine

## 2016-03-19 DIAGNOSIS — J069 Acute upper respiratory infection, unspecified: Secondary | ICD-10-CM | POA: Diagnosis not present

## 2016-03-19 DIAGNOSIS — R05 Cough: Secondary | ICD-10-CM

## 2016-03-19 DIAGNOSIS — R059 Cough, unspecified: Secondary | ICD-10-CM

## 2016-03-19 IMAGING — CR DG CHEST 2V
2 series · 2 of 2 positions shown · non-contrast
Comparison: None.

CLINICAL DATA: Cough with congestion for 5 days, mild shortness of
breath

EXAM:
CHEST  2 VIEW

[w chest pa]
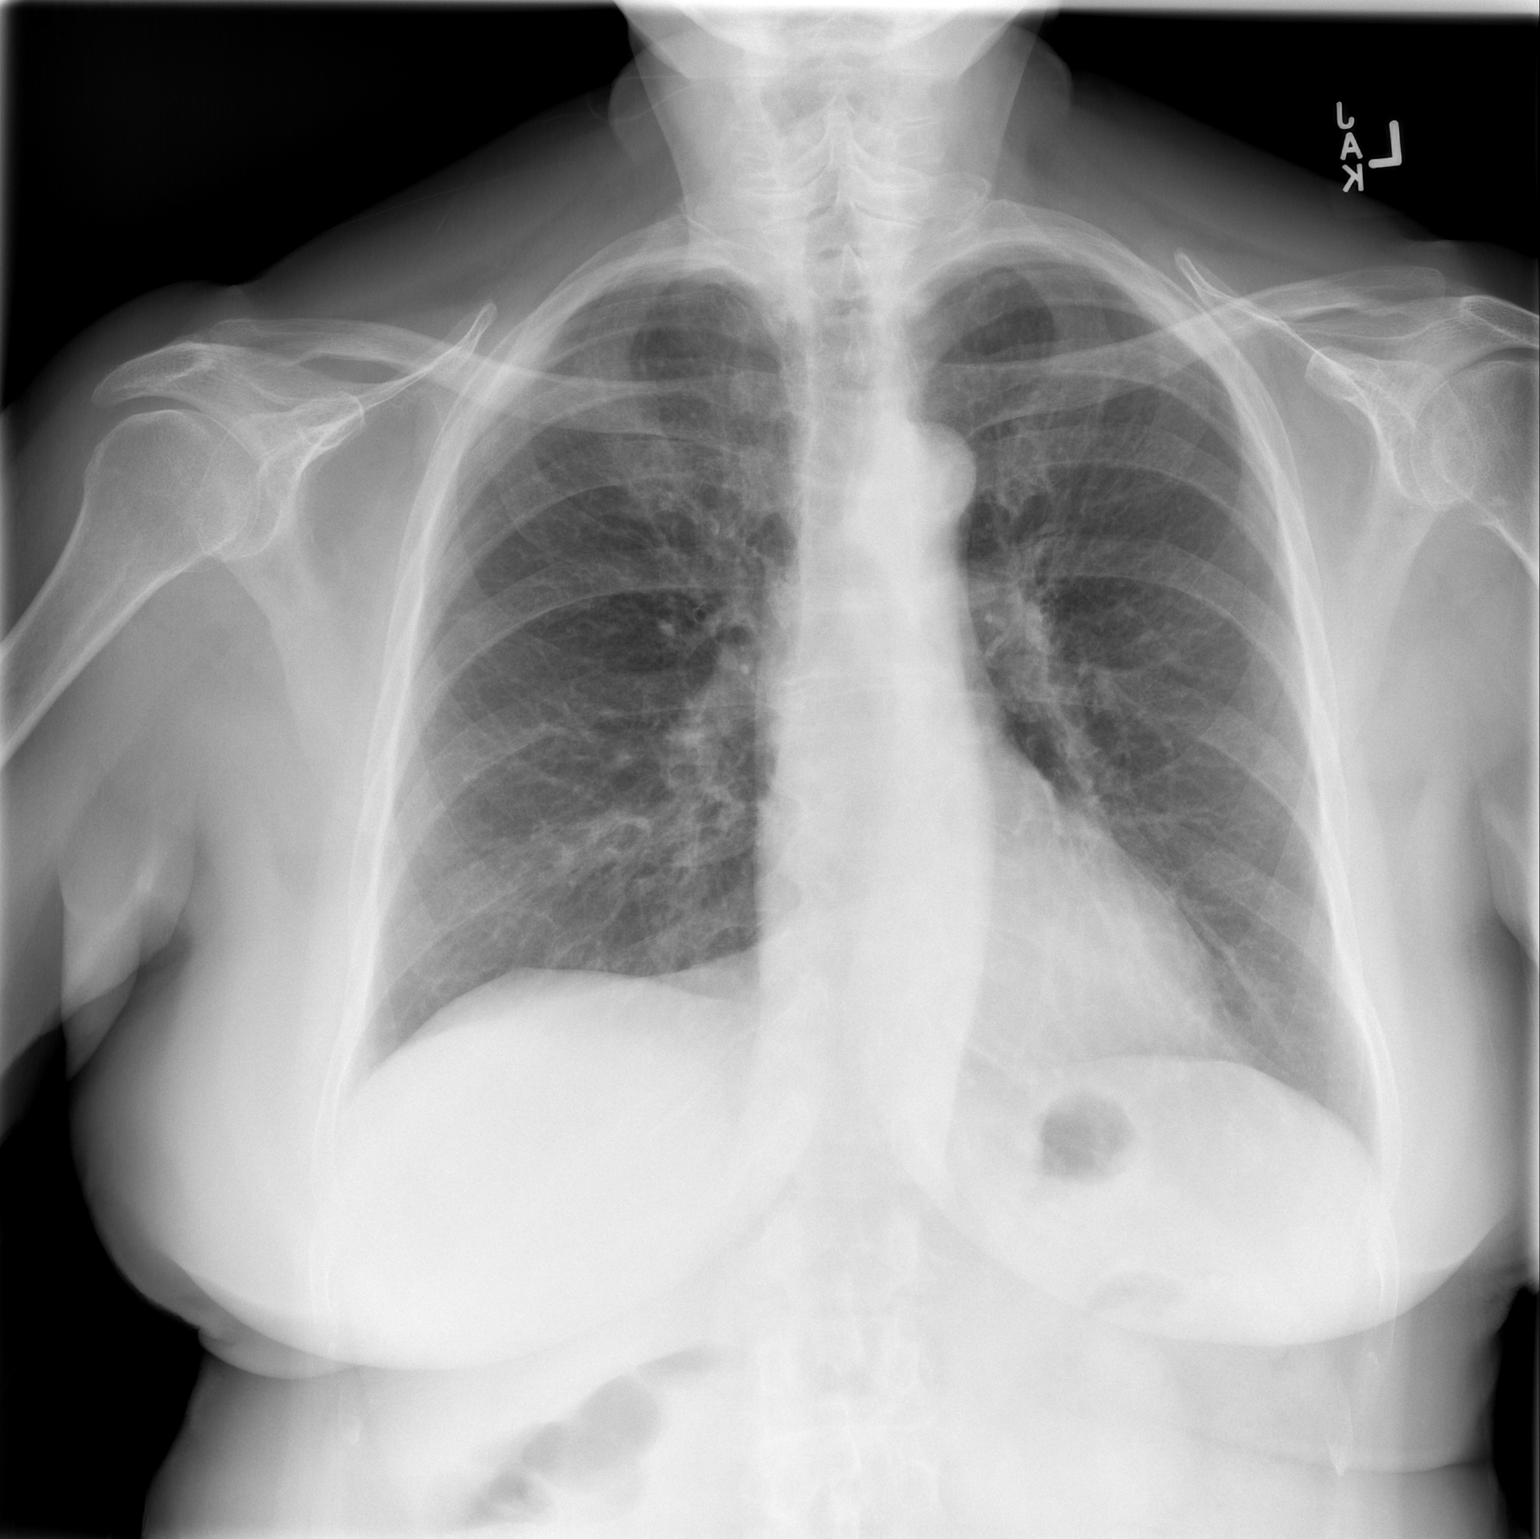

[w chest lat]
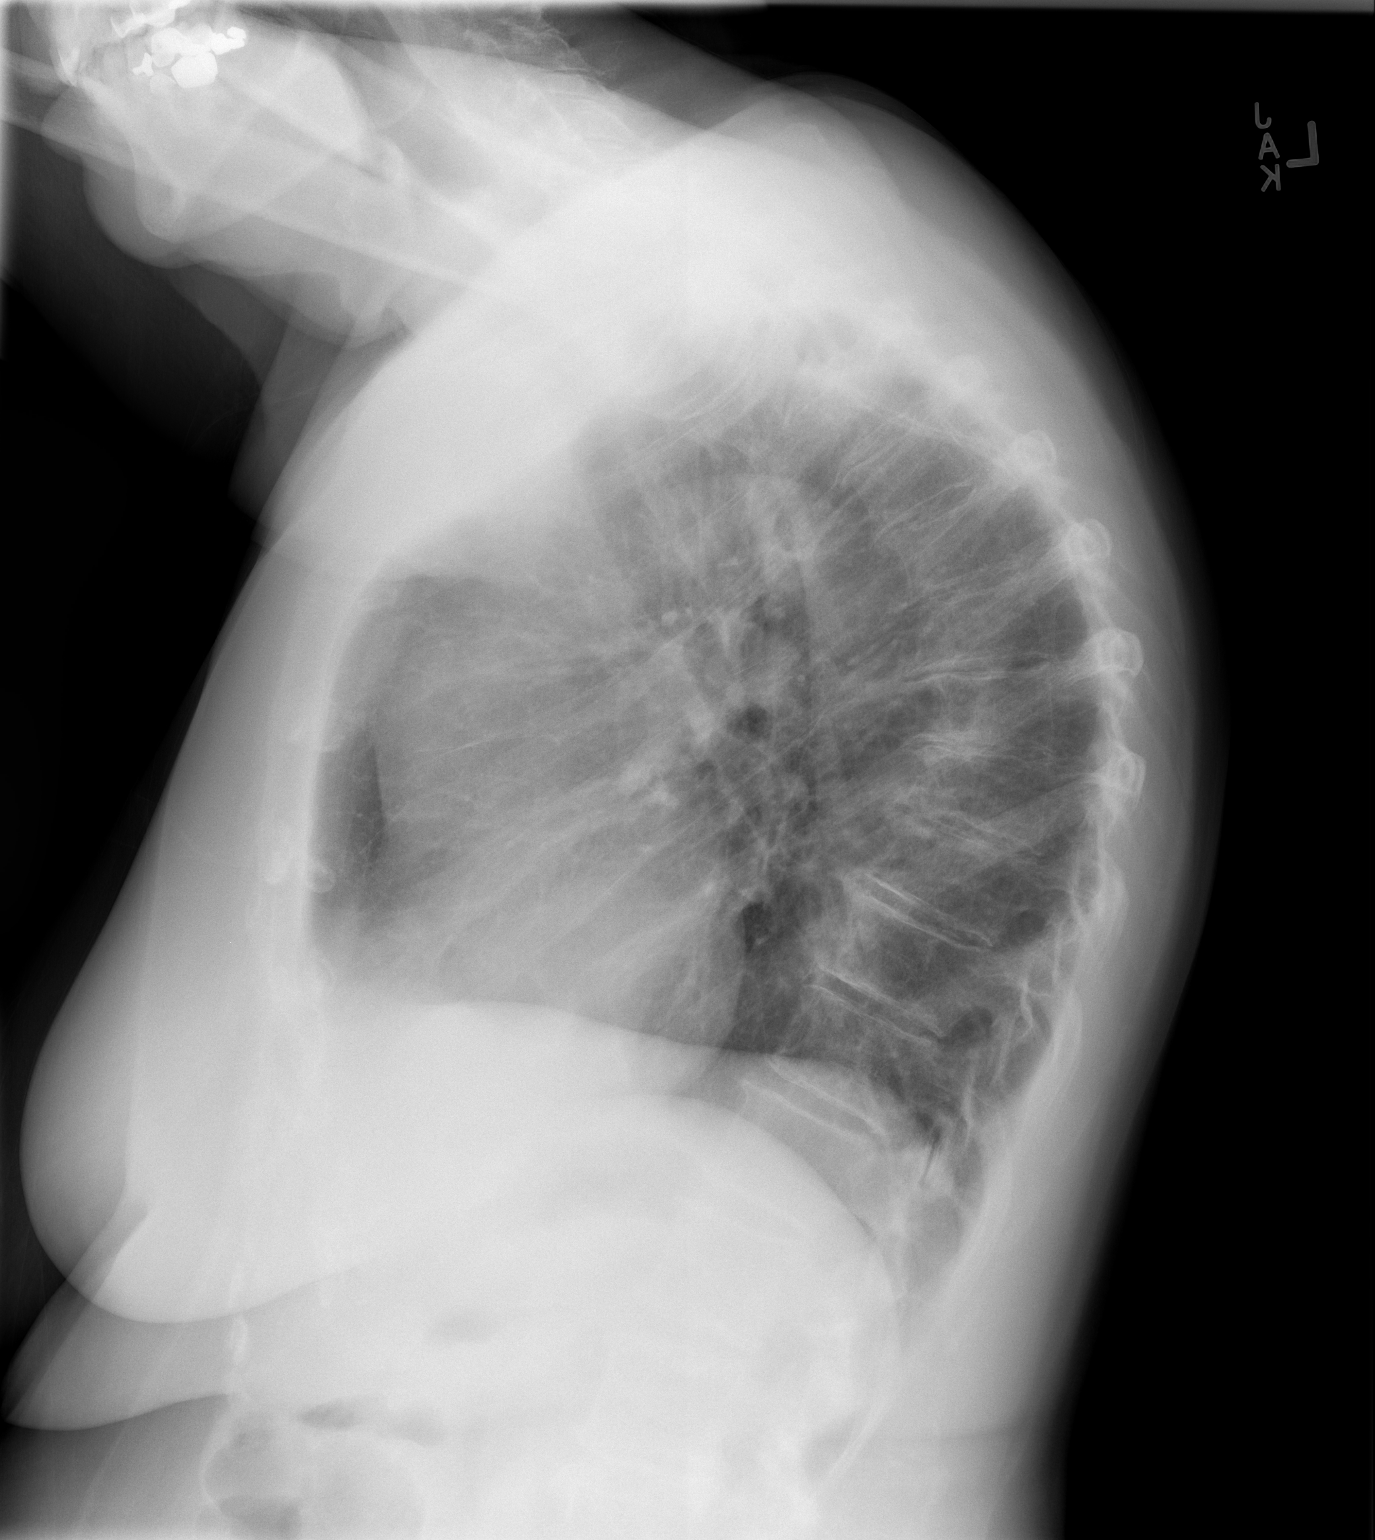

[2 of 2 positions shown; findings below may reference images not displayed]

FINDINGS: Minimal lingular opacity, could reflect atelectasis or a small
infiltrate. No consolidation. No effusion. Normal heart size. No
pneumothorax. Minimal wedging of mid thoracic vertebra with mild
kyphosis.
IMPRESSION: Minimal lingular atelectasis versus small infiltrate.

## 2016-05-28 ENCOUNTER — Other Ambulatory Visit: Payer: Self-pay | Admitting: Family Medicine

## 2016-05-28 ENCOUNTER — Ambulatory Visit
Admission: RE | Admit: 2016-05-28 | Discharge: 2016-05-28 | Disposition: A | Discharge status: Home / Self Care | Payer: PPO | Source: Ambulatory Visit | Attending: Family Medicine | Admitting: Family Medicine

## 2016-05-28 DIAGNOSIS — R059 Cough, unspecified: Secondary | ICD-10-CM

## 2016-05-28 DIAGNOSIS — E559 Vitamin D deficiency, unspecified: Secondary | ICD-10-CM | POA: Diagnosis not present

## 2016-05-28 DIAGNOSIS — Z79899 Other long term (current) drug therapy: Secondary | ICD-10-CM | POA: Diagnosis not present

## 2016-05-28 DIAGNOSIS — J301 Allergic rhinitis due to pollen: Secondary | ICD-10-CM | POA: Diagnosis not present

## 2016-05-28 DIAGNOSIS — R05 Cough: Secondary | ICD-10-CM

## 2016-05-28 DIAGNOSIS — R918 Other nonspecific abnormal finding of lung field: Secondary | ICD-10-CM | POA: Diagnosis not present

## 2016-05-28 DIAGNOSIS — Z0000 Encounter for general adult medical examination without abnormal findings: Secondary | ICD-10-CM | POA: Diagnosis not present

## 2016-05-28 DIAGNOSIS — K219 Gastro-esophageal reflux disease without esophagitis: Secondary | ICD-10-CM | POA: Diagnosis not present

## 2016-05-28 DIAGNOSIS — R5383 Other fatigue: Secondary | ICD-10-CM | POA: Diagnosis not present

## 2016-05-28 IMAGING — DX DG CHEST 2V
2 series · 2 of 2 positions shown · non-contrast
Comparison: [DATE]

CLINICAL DATA: Follow-up infiltrate in lingula

EXAM:
CHEST  2 VIEW

[dg chest 2 view (1 of 2)]
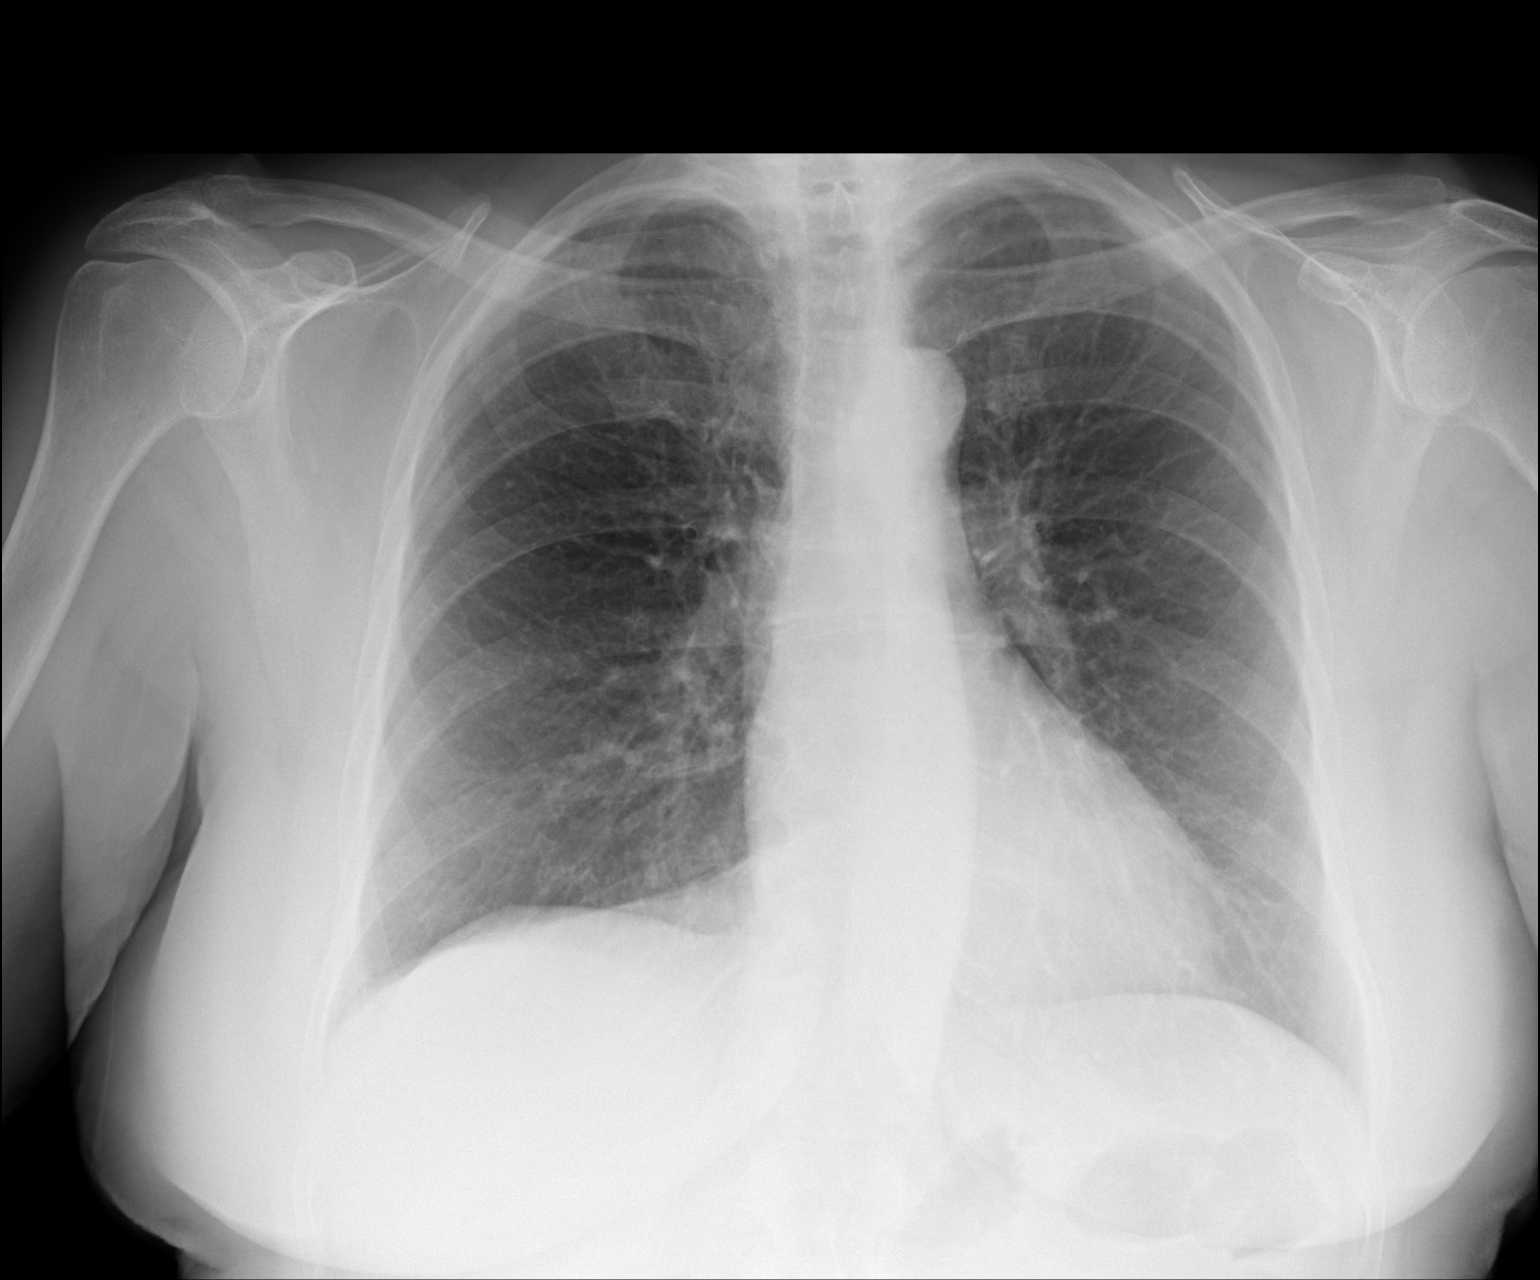

[dg chest 2 view (2 of 2)]
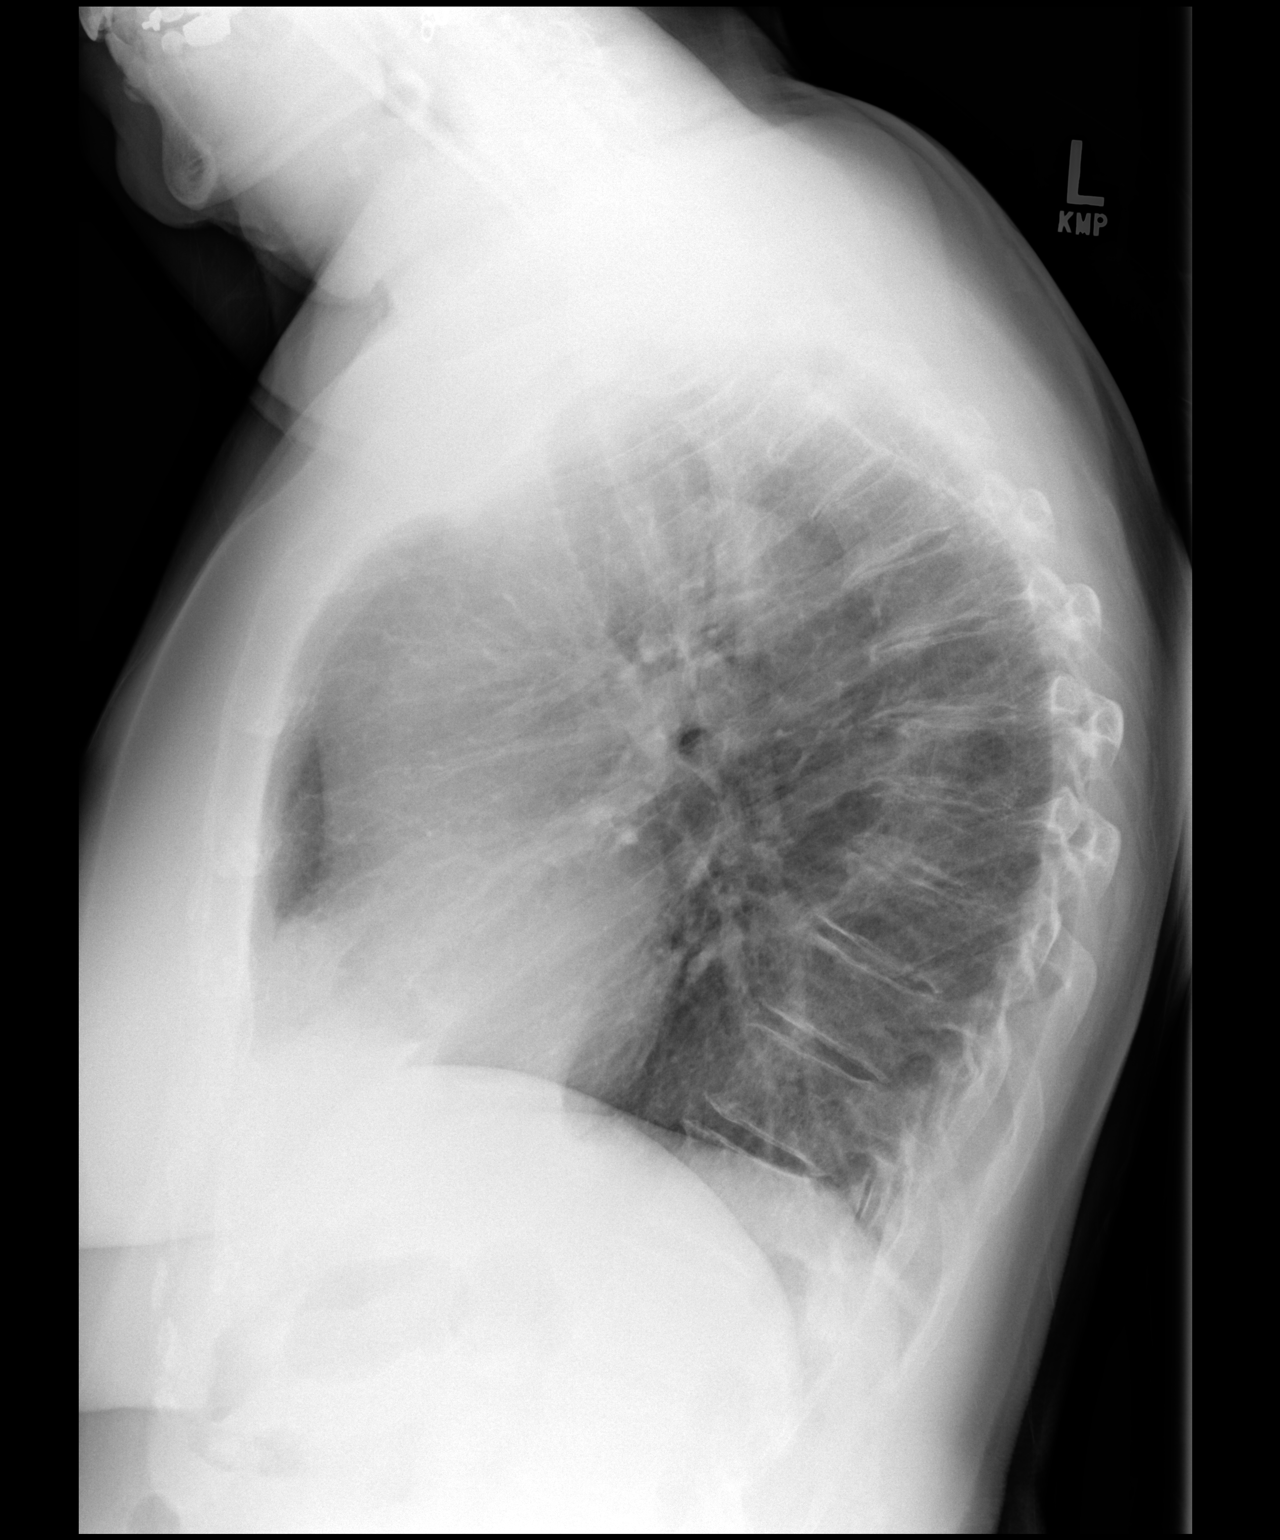

[2 of 2 positions shown; findings below may reference images not displayed]

FINDINGS: Cardiomediastinal silhouette is stable. No infiltrate or pleural
effusion. Previous atelectasis or infiltrate in lingula has cleared.
Degenerative changes thoracic spine again noted.
IMPRESSION: No active cardiopulmonary disease.

## 2017-01-13 DIAGNOSIS — H2513 Age-related nuclear cataract, bilateral: Secondary | ICD-10-CM | POA: Diagnosis not present

## 2017-01-13 DIAGNOSIS — H524 Presbyopia: Secondary | ICD-10-CM | POA: Diagnosis not present

## 2017-01-13 DIAGNOSIS — H353131 Nonexudative age-related macular degeneration, bilateral, early dry stage: Secondary | ICD-10-CM | POA: Diagnosis not present

## 2017-01-13 DIAGNOSIS — H25013 Cortical age-related cataract, bilateral: Secondary | ICD-10-CM | POA: Diagnosis not present

## 2017-06-07 DIAGNOSIS — M25551 Pain in right hip: Secondary | ICD-10-CM | POA: Diagnosis not present

## 2017-06-07 DIAGNOSIS — M25559 Pain in unspecified hip: Secondary | ICD-10-CM | POA: Diagnosis not present

## 2017-06-16 DIAGNOSIS — Z0000 Encounter for general adult medical examination without abnormal findings: Secondary | ICD-10-CM | POA: Diagnosis not present

## 2017-06-16 DIAGNOSIS — J301 Allergic rhinitis due to pollen: Secondary | ICD-10-CM | POA: Diagnosis not present

## 2017-06-16 DIAGNOSIS — K219 Gastro-esophageal reflux disease without esophagitis: Secondary | ICD-10-CM | POA: Diagnosis not present

## 2017-06-16 DIAGNOSIS — Z79899 Other long term (current) drug therapy: Secondary | ICD-10-CM | POA: Diagnosis not present

## 2017-06-16 DIAGNOSIS — E559 Vitamin D deficiency, unspecified: Secondary | ICD-10-CM | POA: Diagnosis not present

## 2018-07-18 DIAGNOSIS — J301 Allergic rhinitis due to pollen: Secondary | ICD-10-CM | POA: Diagnosis not present

## 2018-07-18 DIAGNOSIS — E559 Vitamin D deficiency, unspecified: Secondary | ICD-10-CM | POA: Diagnosis not present

## 2018-07-18 DIAGNOSIS — K219 Gastro-esophageal reflux disease without esophagitis: Secondary | ICD-10-CM | POA: Diagnosis not present

## 2018-07-18 DIAGNOSIS — Z0000 Encounter for general adult medical examination without abnormal findings: Secondary | ICD-10-CM | POA: Diagnosis not present

## 2019-05-03 ENCOUNTER — Ambulatory Visit: Payer: PPO | Attending: Internal Medicine

## 2019-05-03 DIAGNOSIS — Z23 Encounter for immunization: Secondary | ICD-10-CM

## 2019-05-03 NOTE — Progress Notes (Signed)
   Covid-19 Vaccination Clinic  Name:  Carla White    MRN: [id number] DOB: 12/04/1935  05/03/2019  [name] was observed post Covid-19 immunization for 15 minutes without incidence. She was provided with Vaccine Information Sheet and instruction to access the V-Safe system.   [name] was instructed to call 911 with any severe reactions post vaccine: Marland Kitchen Difficulty breathing  . Swelling of your face and throat  . A fast heartbeat  . A bad rash all over your body  . Dizziness and weakness    Immunizations Administered    Name Date Dose VIS Date Route   Pfizer COVID-19 Vaccine 05/03/2019 10:37 AM 0.3 mL 02/16/2019 Intramuscular   Manufacturer: [employer], [employer]   Lot: [id number]   NDC: [address]-4

## 2019-05-08 DIAGNOSIS — H353132 Nonexudative age-related macular degeneration, bilateral, intermediate dry stage: Secondary | ICD-10-CM | POA: Diagnosis not present

## 2019-05-08 DIAGNOSIS — H5203 Hypermetropia, bilateral: Secondary | ICD-10-CM | POA: Diagnosis not present

## 2019-05-08 DIAGNOSIS — H04123 Dry eye syndrome of bilateral lacrimal glands: Secondary | ICD-10-CM | POA: Diagnosis not present

## 2019-05-08 DIAGNOSIS — H52213 Irregular astigmatism, bilateral: Secondary | ICD-10-CM | POA: Diagnosis not present

## 2019-05-08 DIAGNOSIS — H2513 Age-related nuclear cataract, bilateral: Secondary | ICD-10-CM | POA: Diagnosis not present

## 2019-05-08 DIAGNOSIS — H524 Presbyopia: Secondary | ICD-10-CM | POA: Diagnosis not present

## 2019-05-08 DIAGNOSIS — H25013 Cortical age-related cataract, bilateral: Secondary | ICD-10-CM | POA: Diagnosis not present

## 2019-05-09 DIAGNOSIS — H18413 Arcus senilis, bilateral: Secondary | ICD-10-CM | POA: Diagnosis not present

## 2019-05-09 DIAGNOSIS — H25013 Cortical age-related cataract, bilateral: Secondary | ICD-10-CM | POA: Diagnosis not present

## 2019-05-09 DIAGNOSIS — H2513 Age-related nuclear cataract, bilateral: Secondary | ICD-10-CM | POA: Diagnosis not present

## 2019-05-09 DIAGNOSIS — H25043 Posterior subcapsular polar age-related cataract, bilateral: Secondary | ICD-10-CM | POA: Diagnosis not present

## 2019-05-09 DIAGNOSIS — H2511 Age-related nuclear cataract, right eye: Secondary | ICD-10-CM | POA: Diagnosis not present

## 2019-05-23 ENCOUNTER — Ambulatory Visit: Payer: PPO | Attending: Internal Medicine

## 2019-05-23 DIAGNOSIS — Z23 Encounter for immunization: Secondary | ICD-10-CM

## 2019-05-23 NOTE — Progress Notes (Signed)
   Covid-19 Vaccination Clinic  Name:  Carla White    MRN: [id number] DOB: 02-19-1936  05/23/2019  [name] was observed post Covid-19 immunization for 15 minutes without incident. She was provided with Vaccine Information Sheet and instruction to access the V-Safe system.   [name] was instructed to call 911 with any severe reactions post vaccine: Marland Kitchen Difficulty breathing  . Swelling of face and throat  . A fast heartbeat  . A bad rash all over body  . Dizziness and weakness   Immunizations Administered    Name Date Dose VIS Date Route   Pfizer COVID-19 Vaccine 05/23/2019 12:40 PM 0.3 mL 02/16/2019 Intramuscular   Manufacturer: [employer], [employer]   Lot: [id number]   NDC: [address]-9

## 2019-05-29 DIAGNOSIS — H25811 Combined forms of age-related cataract, right eye: Secondary | ICD-10-CM | POA: Diagnosis not present

## 2019-05-29 DIAGNOSIS — H2511 Age-related nuclear cataract, right eye: Secondary | ICD-10-CM | POA: Diagnosis not present

## 2019-05-29 DIAGNOSIS — H2512 Age-related nuclear cataract, left eye: Secondary | ICD-10-CM | POA: Diagnosis not present

## 2019-05-29 DIAGNOSIS — H25042 Posterior subcapsular polar age-related cataract, left eye: Secondary | ICD-10-CM | POA: Diagnosis not present

## 2019-05-29 DIAGNOSIS — H25012 Cortical age-related cataract, left eye: Secondary | ICD-10-CM | POA: Diagnosis not present

## 2019-06-12 DIAGNOSIS — H2512 Age-related nuclear cataract, left eye: Secondary | ICD-10-CM | POA: Diagnosis not present

## 2019-06-12 DIAGNOSIS — H2511 Age-related nuclear cataract, right eye: Secondary | ICD-10-CM | POA: Diagnosis not present

## 2019-06-12 DIAGNOSIS — Z961 Presence of intraocular lens: Secondary | ICD-10-CM | POA: Diagnosis not present

## 2019-06-19 DIAGNOSIS — H2511 Age-related nuclear cataract, right eye: Secondary | ICD-10-CM | POA: Diagnosis not present

## 2019-06-19 DIAGNOSIS — H2512 Age-related nuclear cataract, left eye: Secondary | ICD-10-CM | POA: Diagnosis not present

## 2019-06-19 DIAGNOSIS — Z961 Presence of intraocular lens: Secondary | ICD-10-CM | POA: Diagnosis not present

## 2019-07-26 DIAGNOSIS — J301 Allergic rhinitis due to pollen: Secondary | ICD-10-CM | POA: Diagnosis not present

## 2019-07-26 DIAGNOSIS — Z0000 Encounter for general adult medical examination without abnormal findings: Secondary | ICD-10-CM | POA: Diagnosis not present

## 2019-07-26 DIAGNOSIS — E559 Vitamin D deficiency, unspecified: Secondary | ICD-10-CM | POA: Diagnosis not present

## 2019-07-26 DIAGNOSIS — K219 Gastro-esophageal reflux disease without esophagitis: Secondary | ICD-10-CM | POA: Diagnosis not present

## 2019-08-15 DIAGNOSIS — Z1231 Encounter for screening mammogram for malignant neoplasm of breast: Secondary | ICD-10-CM | POA: Diagnosis not present

## 2020-07-29 DIAGNOSIS — E559 Vitamin D deficiency, unspecified: Secondary | ICD-10-CM | POA: Diagnosis not present

## 2020-07-29 DIAGNOSIS — K219 Gastro-esophageal reflux disease without esophagitis: Secondary | ICD-10-CM | POA: Diagnosis not present

## 2020-07-29 DIAGNOSIS — Z0000 Encounter for general adult medical examination without abnormal findings: Secondary | ICD-10-CM | POA: Diagnosis not present

## 2020-07-29 DIAGNOSIS — J301 Allergic rhinitis due to pollen: Secondary | ICD-10-CM | POA: Diagnosis not present

## 2020-08-05 DIAGNOSIS — M25551 Pain in right hip: Secondary | ICD-10-CM | POA: Diagnosis not present

## 2020-08-13 ENCOUNTER — Emergency Department (HOSPITAL_COMMUNITY): Payer: PPO

## 2020-08-13 ENCOUNTER — Inpatient Hospital Stay (HOSPITAL_COMMUNITY)
Admission: EM | Admit: 2020-08-13 | Discharge: 2020-08-20 | DRG: 092 | Disposition: A | Discharge status: Home Health | Payer: PPO | Attending: Student | Admitting: Student

## 2020-08-13 DIAGNOSIS — R4701 Aphasia: Secondary | ICD-10-CM | POA: Diagnosis present

## 2020-08-13 DIAGNOSIS — Z885 Allergy status to narcotic agent status: Secondary | ICD-10-CM

## 2020-08-13 DIAGNOSIS — G9340 Encephalopathy, unspecified: Secondary | ICD-10-CM | POA: Diagnosis present

## 2020-08-13 DIAGNOSIS — Z882 Allergy status to sulfonamides status: Secondary | ICD-10-CM

## 2020-08-13 DIAGNOSIS — Z888 Allergy status to other drugs, medicaments and biological substances status: Secondary | ICD-10-CM

## 2020-08-13 DIAGNOSIS — R112 Nausea with vomiting, unspecified: Secondary | ICD-10-CM | POA: Diagnosis present

## 2020-08-13 DIAGNOSIS — G4489 Other headache syndrome: Secondary | ICD-10-CM | POA: Diagnosis not present

## 2020-08-13 DIAGNOSIS — Z0389 Encounter for observation for other suspected diseases and conditions ruled out: Secondary | ICD-10-CM | POA: Diagnosis not present

## 2020-08-13 DIAGNOSIS — Z880 Allergy status to penicillin: Secondary | ICD-10-CM

## 2020-08-13 DIAGNOSIS — Z20822 Contact with and (suspected) exposure to covid-19: Secondary | ICD-10-CM | POA: Diagnosis present

## 2020-08-13 DIAGNOSIS — I639 Cerebral infarction, unspecified: Secondary | ICD-10-CM | POA: Diagnosis not present

## 2020-08-13 DIAGNOSIS — R29700 NIHSS score 0: Secondary | ICD-10-CM | POA: Diagnosis present

## 2020-08-13 DIAGNOSIS — I6621 Occlusion and stenosis of right posterior cerebral artery: Secondary | ICD-10-CM | POA: Diagnosis not present

## 2020-08-13 DIAGNOSIS — R4189 Other symptoms and signs involving cognitive functions and awareness: Secondary | ICD-10-CM | POA: Diagnosis present

## 2020-08-13 DIAGNOSIS — Z79899 Other long term (current) drug therapy: Secondary | ICD-10-CM

## 2020-08-13 DIAGNOSIS — R519 Headache, unspecified: Secondary | ICD-10-CM | POA: Diagnosis not present

## 2020-08-13 DIAGNOSIS — R29810 Facial weakness: Secondary | ICD-10-CM | POA: Diagnosis not present

## 2020-08-13 DIAGNOSIS — R4781 Slurred speech: Secondary | ICD-10-CM | POA: Diagnosis present

## 2020-08-13 DIAGNOSIS — R4789 Other speech disturbances: Secondary | ICD-10-CM

## 2020-08-13 DIAGNOSIS — I6522 Occlusion and stenosis of left carotid artery: Secondary | ICD-10-CM | POA: Diagnosis present

## 2020-08-13 DIAGNOSIS — R000 Tachycardia, unspecified: Secondary | ICD-10-CM | POA: Diagnosis present

## 2020-08-13 DIAGNOSIS — R404 Transient alteration of awareness: Secondary | ICD-10-CM | POA: Diagnosis not present

## 2020-08-13 DIAGNOSIS — R9401 Abnormal electroencephalogram [EEG]: Secondary | ICD-10-CM | POA: Diagnosis not present

## 2020-08-13 DIAGNOSIS — R569 Unspecified convulsions: Secondary | ICD-10-CM

## 2020-08-13 DIAGNOSIS — R110 Nausea: Secondary | ICD-10-CM | POA: Diagnosis not present

## 2020-08-13 DIAGNOSIS — G3184 Mild cognitive impairment, so stated: Secondary | ICD-10-CM | POA: Diagnosis not present

## 2020-08-13 DIAGNOSIS — R4689 Other symptoms and signs involving appearance and behavior: Secondary | ICD-10-CM | POA: Diagnosis not present

## 2020-08-13 DIAGNOSIS — I6602 Occlusion and stenosis of left middle cerebral artery: Secondary | ICD-10-CM | POA: Diagnosis not present

## 2020-08-13 LAB — COMPREHENSIVE METABOLIC PANEL
ALT: 21 U/L (ref 0–44)
AST: 28 U/L (ref 15–41)
Albumin: 3.9 g/dL (ref 3.5–5.0)
Alkaline Phosphatase: 102 U/L (ref 38–126)
Anion gap: 10 (ref 5–15)
BUN: 16 mg/dL (ref 8–23)
CO2: 25 mmol/L (ref 22–32)
Calcium: 9.1 mg/dL (ref 8.9–10.3)
Chloride: 100 mmol/L (ref 98–111)
Creatinine, Ser: 1.04 mg/dL — ABNORMAL HIGH (ref 0.44–1.00)
GFR, Estimated: 53 mL/min — ABNORMAL LOW (ref >60)
Glucose, Bld: 146 mg/dL — ABNORMAL HIGH (ref 70–99)
Potassium: 4.3 mmol/L (ref 3.5–5.1)
Sodium: 135 mmol/L (ref 135–145)
Total Bilirubin: 0.8 mg/dL (ref 0.3–1.2)
Total Protein: 7 g/dL (ref 6.5–8.1)

## 2020-08-13 LAB — APTT: aPTT: 29 seconds (ref 24–36)

## 2020-08-13 LAB — CBC
HCT: 38.4 % (ref 36.0–46.0)
Hemoglobin: 13 g/dL (ref 12.0–15.0)
MCH: 32.3 pg (ref 26.0–34.0)
MCHC: 33.9 g/dL (ref 30.0–36.0)
MCV: 95.5 fL (ref 80.0–100.0)
Platelets: 352 10*3/uL (ref 150–400)
RBC: 4.02 MIL/uL (ref 3.87–5.11)
RDW: 12.9 % (ref 11.5–15.5)
WBC: 11.2 10*3/uL — ABNORMAL HIGH (ref 4.0–10.5)
nRBC: 0 % (ref 0.0–0.2)

## 2020-08-13 LAB — DIFFERENTIAL
Abs Immature Granulocytes: 0.04 10*3/uL (ref 0.00–0.07)
Basophils Absolute: 0 10*3/uL (ref 0.0–0.1)
Basophils Relative: 0 %
Eosinophils Absolute: 0 10*3/uL (ref 0.0–0.5)
Eosinophils Relative: 0 %
Immature Granulocytes: 0 %
Lymphocytes Relative: 16 %
Lymphs Abs: 1.7 10*3/uL (ref 0.7–4.0)
Monocytes Absolute: 0.7 10*3/uL (ref 0.1–1.0)
Monocytes Relative: 6 %
Neutro Abs: 8.7 10*3/uL — ABNORMAL HIGH (ref 1.7–7.7)
Neutrophils Relative %: 78 %

## 2020-08-13 LAB — PROTIME-INR
INR: 1.1 (ref 0.8–1.2)
Prothrombin Time: 14.3 seconds (ref 11.4–15.2)

## 2020-08-13 LAB — I-STAT CHEM 8, ED
BUN: 17 mg/dL (ref 8–23)
Calcium, Ion: 1.16 mmol/L (ref 1.15–1.40)
Chloride: 100 mmol/L (ref 98–111)
Creatinine, Ser: 0.9 mg/dL (ref 0.44–1.00)
Glucose, Bld: 142 mg/dL — ABNORMAL HIGH (ref 70–99)
HCT: 39 % (ref 36.0–46.0)
Hemoglobin: 13.3 g/dL (ref 12.0–15.0)
Potassium: 4.3 mmol/L (ref 3.5–5.1)
Sodium: 135 mmol/L (ref 135–145)
TCO2: 25 mmol/L (ref 22–32)

## 2020-08-13 LAB — CBG MONITORING, ED: Glucose-Capillary: 138 mg/dL — ABNORMAL HIGH (ref 70–99)

## 2020-08-13 LAB — ETHANOL: Alcohol, Ethyl (B): <10 mg/dL (ref <10)

## 2020-08-13 MED ORDER — ONDANSETRON HCL 4 MG/2ML IJ SOLN
INTRAMUSCULAR | Status: AC
  Administered 2020-08-13: 4 mg
  Filled 2020-08-13: qty 2

## 2020-08-13 MED ORDER — IOHEXOL 350 MG/ML SOLN
100.0000 mL | Freq: Once | INTRAVENOUS | Status: AC | PRN
  Administered 2020-08-13: 100 mL via INTRAVENOUS

## 2020-08-13 NOTE — ED Triage Notes (Signed)
Pt arrived via ems as a code stroke. EMS reports pts daughter reported at noon when she was on the phone with her she was having weird speech. Pts daughter spoke with her after her work and the weird speech went away. EMS was called later in the day and arrived at 2214.EMS reports stroke screen was negative initially and then pt had trouble finding her words.

## 2020-08-13 NOTE — ED Provider Notes (Signed)
[facility] EMERGENCY DEPARTMENT Provider Note  CSN: [id number] Arrival date & time: 08/13/20 2303  Chief Complaint(s) Code Stroke  2328  ED Triage Notes Pt arrived via ems as a code stroke. EMS reports pts daughter reported at noon when she was on the phone with her she was having weird speech. Pts daughter spoke with her after her work and the weird speech went away. EMS was called later in the day and arrived at 2214.EMS reports stroke screen was negative initially and then pt had trouble finding her words.    HPI Carla White is a 85 y.o. female with a past medical history of vitamin D deficiency who presents to the emergency department as a code stroke called by EMS for aphasia. Patient was last seen normal 1 week ago. Daughter called today and noted that the patient's speech was off. Apparently several other friends and family members called to check in and some reported trouble speaking and some reported normal speech. When EMS arrived, they noted that the patient's speech was clear. While in route she had a recurrent episode of aphasia prompting the code stroke initiation. In addition patient is reporting nausea which she was given Zofran for. She complained of headache.  Upon arrival to the emergency department the patient's speech was clear.  During the initial evaluation at the bridge, she had another episode of word finding difficulty associated with severe nausea. Patient denied any chest pain or shortness of breath.  No abdominal pain or diarrhea. No known suspicious food intake.   HPI  Past Medical History No past medical history on file. There are no problems to display for this patient.  Home Medication(s) Prior to Admission medications   Medication Sig Start Date End Date Taking? Authorizing Provider  Cyanocobalamin (VITAMIN B12 PO) Take 1 tablet by mouth daily.   Yes [provider]  GLUCOSAMINE HCL PO Take 1 capsule by mouth  daily.   Yes [provider]  Magnesium 200 MG TABS Take 200 mg by mouth daily.   Yes [provider]  meclizine (ANTIVERT) 25 MG tablet Take 25-50 mg by mouth 3 (three) times daily as needed for dizziness or nausea. 08/31/12  Yes [provider]  Multiple Vitamin (MULTIVITAMIN) tablet Take 1 tablet by mouth daily.   Yes [provider]  Turmeric 500 MG CAPS Take 500 mg by mouth daily.   Yes [provider]  vitamin E 45 MG (100 UNITS) capsule Take 100 Units by mouth daily.   Yes [provider]                                                                                                                                    Past Surgical History ** The histories are not reviewed yet. Please review them in the "History" navigator section and refresh this SmartLink. Family History No family history on file.  Social History  Allergies Claritin-d 24 hour [loratadine-pseudoephedrine er], Codeine, Darvon [propoxyphene], Fexofenadine, Loratadine, Penicillin g, and Septra [sulfamethoxazole-trimethoprim]  Review of Systems Review of Systems All other systems are reviewed and are negative for acute change except as noted in the HPI  Physical Exam Vital Signs  I have reviewed the triage vital signs BP (!) 145/85   Pulse 95   Temp (!) 97.5 F (36.4 C) (Oral)   Resp 19   Ht  (1.702 m)   Wt 58.1 kg   SpO2 97%   BMI 20.05 kg/m   Physical Exam Vitals reviewed.  Constitutional:      General: She is not in acute distress.    Appearance: She is well-developed. She is not diaphoretic.  HENT:     Head: Normocephalic and atraumatic.     Nose: Nose normal.  Eyes:     General: No scleral icterus.       Right eye: No discharge.        Left eye: No discharge.     Conjunctiva/sclera: Conjunctivae normal.     Pupils: Pupils are equal, round, and reactive to light.  Cardiovascular:     Rate and Rhythm: Regular rhythm. Tachycardia  present.     Heart sounds: No murmur heard. No friction rub. No gallop.   Pulmonary:     Effort: Pulmonary effort is normal. No respiratory distress.     Breath sounds: Normal breath sounds. No stridor. No rales.  Abdominal:     General: There is no distension.     Palpations: Abdomen is soft.     Tenderness: There is no abdominal tenderness.  Musculoskeletal:        General: No tenderness.     Cervical back: Normal range of motion and neck supple.  Skin:    General: Skin is warm and dry.     Findings: No erythema or rash.  Neurological:     Mental Status: She is alert and oriented to person, place, and time.     Comments: Mental Status:  Alert and oriented to person, place, and time.  Attention and concentration normal initially, then became delayed.  Speech clear initially, then had word finding difficulty.  Recent memory not obtained.  Cranial Nerves:  II Visual Fields: Intact to confrontation. Visual fields intact. III, IV, VI: Pupils equal and reactive to light and near. Full eye movement without nystagmus  V Facial Sensation: Normal. No weakness of masticatory muscles  VII: No facial weakness or asymmetry  VIII Auditory Acuity: Grossly normal  IX/X: The uvula is midline; the palate elevates symmetrically  XI: Normal sternocleidomastoid and trapezius strength  XII: The tongue is midline. No atrophy or fasciculations.   Motor System: Muscle Strength: 5/5 and symmetric in the upper and lower extremities. No pronation or drift.  Muscle Tone: Tone and muscle bulk are normal in the upper and lower extremities.   Reflexes: DTRs: 1+ and symmetrical in all four extremities. No Clonus Coordination: Intact finger-to-nose. No tremor.  Sensation: Intact to light touch. Gait: deferred      ED Results and Treatments Labs (all labs ordered are listed, but only abnormal results are displayed) Labs Reviewed  CBC - Abnormal; Notable for the following components:      Result Value    WBC 11.2 (*)    All other components within normal limits  DIFFERENTIAL - Abnormal; Notable for the following components:   Neutro Abs 8.7 (*)    All other components within normal limits  COMPREHENSIVE METABOLIC  PANEL - Abnormal; Notable for the following components:   Glucose, Bld 146 (*)    Creatinine, Ser 1.04 (*)    GFR, Estimated 53 (*)    All other components within normal limits  I-STAT CHEM 8, ED - Abnormal; Notable for the following components:   Glucose, Bld 142 (*)    All other components within normal limits  CBG MONITORING, ED - Abnormal; Notable for the following components:   Glucose-Capillary 138 (*)    All other components within normal limits  RESP PANEL BY RT-PCR (FLU A&B, COVID) ARPGX2  ETHANOL  PROTIME-INR  APTT  MAGNESIUM  RAPID URINE DRUG SCREEN, HOSP PERFORMED  URINALYSIS, ROUTINE W REFLEX MICROSCOPIC  TROPONIN I (HIGH SENSITIVITY)                                                                                                                         EKG  EKG Interpretation  Date/Time:  Wednesday August 13 2020 23:43:29 EDT Ventricular Rate:  101 PR Interval:    QRS Duration: 76 QT Interval:  353 QTC Calculation: 458 R Axis:   84 Text Interpretation: Sinus tachycardia Borderline right axis deviation No old tracing to compare Confirmed by Drema Pry [id number]) on 08/13/2020 11:56:16 PM      Radiology CT CEREBRAL PERFUSION W CONTRAST  Result Date: 08/14/2020 CLINICAL DATA:  Initial evaluation for acute aphasia. EXAM: CT ANGIOGRAPHY HEAD AND NECK CT PERFUSION BRAIN TECHNIQUE: Multidetector CT imaging of the head and neck was performed using the standard protocol during bolus administration of intravenous contrast. Multiplanar CT image reconstructions and MIPs were obtained to evaluate the vascular anatomy. Carotid stenosis measurements (when applicable) are obtained utilizing NASCET criteria, using the distal internal carotid diameter as the denominator.  Multiphase CT imaging of the brain was performed following IV bolus contrast injection. Subsequent parametric perfusion maps were calculated using RAPID software. CONTRAST:  OMNIPAQUE IOHEXOL 350 MG/ML SOLN COMPARISON:  None available. FINDINGS: CT HEAD FINDINGS Brain: Generalized age-related cerebral atrophy with moderate chronic microvascular ischemic disease. No acute intracranial hemorrhage. No acute large vessel territory infarct. No mass lesion or midline shift. No hydrocephalus or extra-axial fluid collection. Vascular: No hyperdense vessel. Scattered vascular calcifications noted within the carotid siphons. Skull: No visible scalp soft tissue abnormality.  Calvarium intact. Sinuses/Orbits: Globes and orbital soft tissues demonstrate no acute finding. Paranasal sinuses are largely clear. Sequelae of chronic left maxillary sinusitis noted. No mastoid effusion. Other: None. ASPECTS (Alberta Stroke Program Early CT Score) - Ganglionic level infarction (caudate, lentiform nuclei, internal capsule, insula, M1-M3 cortex): 7 - Supraganglionic infarction (M4-M6 cortex): 3 Total score (0-10 with 10 being normal): 10 Review of the MIP images confirms the above findings CTA NECK FINDINGS Aortic arch: Visualized aortic arch normal caliber with normal 3 vessel morphology. Mild atheromatous change about the arch itself. No hemodynamically significant stenosis about the origin the great vessels. Right carotid system: Right common and internal carotid arteries are diffusely tortuous but widely patent without  stenosis, dissection or occlusion. Left carotid system: Left CCA tortuous but is widely patent to the bifurcation. Eccentric soft plaque at the proximal left ICA without significant stenosis. Left ICA mildly tortuous but widely patent distally without stenosis, dissection or occlusion. Vertebral arteries: Both vertebral arteries arise from the subclavian arteries. No proximal subclavian artery stenosis. Left  vertebral artery dominant. Vertebral arteries are tortuous but widely patent to the skull base without stenosis, dissection or occlusion. Skeleton: No visible acute osseous abnormality. Mild chronic height loss noted at the T4 vertebral body. No discrete or worrisome osseous lesions. Moderate spondylosis present at C4-5 through C6-7. Torus mandibularis noted. Other neck: No other acute soft tissue abnormality within the neck. No mass or adenopathy. Upper chest: Visualized upper chest demonstrates no acute finding. Review of the MIP images confirms the above findings CTA HEAD FINDINGS Anterior circulation: Petrous segments widely patent. Mild atheromatous change within the carotid siphons without hemodynamically significant stenosis. ICAs are somewhat dolichoectatic through the carotid siphons. A1 segments patent bilaterally. Normal anterior communicating artery complex. Right ACA widely patent. Short-segment moderate left A2 stenosis noted (series 8, image 79). No M1 stenosis or occlusion. Normal MCA bifurcations. Distal MCA branches well perfused and symmetric. Patent Posterior circulation: Both vertebral arteries patent to the vertebrobasilar junction without stenosis. Left vertebral artery dominant. Both PICA origins patent and normal. Basilar patent to its distal aspect without stenosis. Superior cerebellar arteries patent bilaterally. Right PCA supplied via the basilar. Multifocal moderate stenoses noted involving the mid and distal right P2 segment. Fetal type origin of the left PCA. Left PCA well perfused to its distal aspect without stenosis. Venous sinuses: Patent allowing for timing of the contrast bolus. Anatomic variants: Fetal type origin of the left PCA. Dominant left vertebral artery. No intracranial aneurysm. Review of the MIP images confirms the above findings CT Brain Perfusion Findings: ASPECTS: 10 CBF (<30%) Volume: 64mL Perfusion (Tmax>6.0s) volume: 4mL Mismatch Volume: 55mL Infarction Location:No  evidence for acute ischemia by CT perfusion. Apparent 3 cc perfusion deficit at the posterior right parietal vertex favored to be artifactual. No definite perfusion abnormality. IMPRESSION: CT HEAD IMPRESSION: 1. No acute intracranial abnormality identified. 2. Aspects = 10. 3. Age-related cerebral atrophy with moderate chronic microvascular ischemic disease. CTA HEAD AND NECK IMPRESSION: 1. Negative CTA for emergent large vessel occlusion. 2. Intracranial atherosclerotic disease with associated moderate left A2 and right P2 stenoses. No other hemodynamically significant or correctable stenosis. 3. Diffuse tortuosity of the major arterial vasculature of the head and neck, suggesting chronic underlying hypertension. CT PERFUSION IMPRESSION: Negative CT perfusion. No evidence for acute core infarct or other perfusion abnormality. Critical Value/emergent results were called by telephone at the time of interpretation on 08/13/2020 at 11:25 pm and again at 11:40 pm to provider [facility], who verbally acknowledged these results. Electronically Signed   By: Rise Mu M.D.   On: 08/14/2020 00:17   CT HEAD CODE STROKE WO CONTRAST  Result Date: 08/14/2020 CLINICAL DATA:  Initial evaluation for acute aphasia. EXAM: CT ANGIOGRAPHY HEAD AND NECK CT PERFUSION BRAIN TECHNIQUE: Multidetector CT imaging of the head and neck was performed using the standard protocol during bolus administration of intravenous contrast. Multiplanar CT image reconstructions and MIPs were obtained to evaluate the vascular anatomy. Carotid stenosis measurements (when applicable) are obtained utilizing NASCET criteria, using the distal internal carotid diameter as the denominator. Multiphase CT imaging of the brain was performed following IV bolus contrast injection. Subsequent parametric perfusion maps were calculated using RAPID software. CONTRAST:  OMNIPAQUE IOHEXOL 350 MG/ML SOLN COMPARISON:  None available. FINDINGS: CT HEAD  FINDINGS Brain: Generalized age-related cerebral atrophy with moderate chronic microvascular ischemic disease. No acute intracranial hemorrhage. No acute large vessel territory infarct. No mass lesion or midline shift. No hydrocephalus or extra-axial fluid collection. Vascular: No hyperdense vessel. Scattered vascular calcifications noted within the carotid siphons. Skull: No visible scalp soft tissue abnormality.  Calvarium intact. Sinuses/Orbits: Globes and orbital soft tissues demonstrate no acute finding. Paranasal sinuses are largely clear. Sequelae of chronic left maxillary sinusitis noted. No mastoid effusion. Other: None. ASPECTS (Alberta Stroke Program Early CT Score) - Ganglionic level infarction (caudate, lentiform nuclei, internal capsule, insula, M1-M3 cortex): 7 - Supraganglionic infarction (M4-M6 cortex): 3 Total score (0-10 with 10 being normal): 10 Review of the MIP images confirms the above findings CTA NECK FINDINGS Aortic arch: Visualized aortic arch normal caliber with normal 3 vessel morphology. Mild atheromatous change about the arch itself. No hemodynamically significant stenosis about the origin the great vessels. Right carotid system: Right common and internal carotid arteries are diffusely tortuous but widely patent without stenosis, dissection or occlusion. Left carotid system: Left CCA tortuous but is widely patent to the bifurcation. Eccentric soft plaque at the proximal left ICA without significant stenosis. Left ICA mildly tortuous but widely patent distally without stenosis, dissection or occlusion. Vertebral arteries: Both vertebral arteries arise from the subclavian arteries. No proximal subclavian artery stenosis. Left vertebral artery dominant. Vertebral arteries are tortuous but widely patent to the skull base without stenosis, dissection or occlusion. Skeleton: No visible acute osseous abnormality. Mild chronic height loss noted at the T4 vertebral body. No discrete or  worrisome osseous lesions. Moderate spondylosis present at C4-5 through C6-7. Torus mandibularis noted. Other neck: No other acute soft tissue abnormality within the neck. No mass or adenopathy. Upper chest: Visualized upper chest demonstrates no acute finding. Review of the MIP images confirms the above findings CTA HEAD FINDINGS Anterior circulation: Petrous segments widely patent. Mild atheromatous change within the carotid siphons without hemodynamically significant stenosis. ICAs are somewhat dolichoectatic through the carotid siphons. A1 segments patent bilaterally. Normal anterior communicating artery complex. Right ACA widely patent. Short-segment moderate left A2 stenosis noted (series 8, image 79). No M1 stenosis or occlusion. Normal MCA bifurcations. Distal MCA branches well perfused and symmetric. Patent Posterior circulation: Both vertebral arteries patent to the vertebrobasilar junction without stenosis. Left vertebral artery dominant. Both PICA origins patent and normal. Basilar patent to its distal aspect without stenosis. Superior cerebellar arteries patent bilaterally. Right PCA supplied via the basilar. Multifocal moderate stenoses noted involving the mid and distal right P2 segment. Fetal type origin of the left PCA. Left PCA well perfused to its distal aspect without stenosis. Venous sinuses: Patent allowing for timing of the contrast bolus. Anatomic variants: Fetal type origin of the left PCA. Dominant left vertebral artery. No intracranial aneurysm. Review of the MIP images confirms the above findings CT Brain Perfusion Findings: ASPECTS: 10 CBF (<30%) Volume: 28mL Perfusion (Tmax>6.0s) volume: 2mL Mismatch Volume: 31mL Infarction Location:No evidence for acute ischemia by CT perfusion. Apparent 3 cc perfusion deficit at the posterior right parietal vertex favored to be artifactual. No definite perfusion abnormality. IMPRESSION: CT HEAD IMPRESSION: 1. No acute intracranial abnormality  identified. 2. Aspects = 10. 3. Age-related cerebral atrophy with moderate chronic microvascular ischemic disease. CTA HEAD AND NECK IMPRESSION: 1. Negative CTA for emergent large vessel occlusion. 2. Intracranial atherosclerotic disease with associated moderate left A2 and right P2 stenoses. No other  hemodynamically significant or correctable stenosis. 3. Diffuse tortuosity of the major arterial vasculature of the head and neck, suggesting chronic underlying hypertension. CT PERFUSION IMPRESSION: Negative CT perfusion. No evidence for acute core infarct or other perfusion abnormality. Critical Value/emergent results were called by telephone at the time of interpretation on 08/13/2020 at 11:25 pm and again at 11:40 pm [facility] who verbally acknowledged these results. Electronically Signed   By: Rise MuBenjamin  McClintock M.D.   On: 08/14/2020 00:17   CT ANGIO HEAD CODE STROKE  Result Date: 08/14/2020 CLINICAL DATA:  Initial evaluation for acute aphasia. EXAM: CT ANGIOGRAPHY HEAD AND NECK CT PERFUSION BRAIN TECHNIQUE: Multidetector CT imaging of the head and neck was performed using the standard protocol during bolus administration of intravenous contrast. Multiplanar CT image reconstructions and MIPs were obtained to evaluate the vascular anatomy. Carotid stenosis measurements (when applicable) are obtained utilizing NASCET criteria, using the distal internal carotid diameter as the denominator. Multiphase CT imaging of the brain was performed following IV bolus contrast injection. Subsequent parametric perfusion maps were calculated using RAPID software. CONTRAST:  100mL OMNIPAQUE IOHEXOL 350 MG/ML SOLN COMPARISON:  None available. FINDINGS: CT HEAD FINDINGS Brain: Generalized age-related cerebral atrophy with moderate chronic microvascular ischemic disease. No acute intracranial hemorrhage. No acute large vessel territory infarct. No mass lesion or midline shift. No hydrocephalus or extra-axial  fluid collection. Vascular: No hyperdense vessel. Scattered vascular calcifications noted within the carotid siphons. Skull: No visible scalp soft tissue abnormality.  Calvarium intact. Sinuses/Orbits: Globes and orbital soft tissues demonstrate no acute finding. Paranasal sinuses are largely clear. Sequelae of chronic left maxillary sinusitis noted. No mastoid effusion. Other: None. ASPECTS (Alberta Stroke Program Early CT Score) - Ganglionic level infarction (caudate, lentiform nuclei, internal capsule, insula, M1-M3 cortex): 7 - Supraganglionic infarction (M4-M6 cortex): 3 Total score (0-10 with 10 being normal): 10 Review of the MIP images confirms the above findings CTA NECK FINDINGS Aortic arch: Visualized aortic arch normal caliber with normal 3 vessel morphology. Mild atheromatous change about the arch itself. No hemodynamically significant stenosis about the origin the great vessels. Right carotid system: Right common and internal carotid arteries are diffusely tortuous but widely patent without stenosis, dissection or occlusion. Left carotid system: Left CCA tortuous but is widely patent to the bifurcation. Eccentric soft plaque at the proximal left ICA without significant stenosis. Left ICA mildly tortuous but widely patent distally without stenosis, dissection or occlusion. Vertebral arteries: Both vertebral arteries arise from the subclavian arteries. No proximal subclavian artery stenosis. Left vertebral artery dominant. Vertebral arteries are tortuous but widely patent to the skull base without stenosis, dissection or occlusion. Skeleton: No visible acute osseous abnormality. Mild chronic height loss noted at the T4 vertebral body. No discrete or worrisome osseous lesions. Moderate spondylosis present at C4-5 through C6-7. Torus mandibularis noted. Other neck: No other acute soft tissue abnormality within the neck. No mass or adenopathy. Upper chest: Visualized upper chest demonstrates no acute  finding. Review of the MIP images confirms the above findings CTA HEAD FINDINGS Anterior circulation: Petrous segments widely patent. Mild atheromatous change within the carotid siphons without hemodynamically significant stenosis. ICAs are somewhat dolichoectatic through the carotid siphons. A1 segments patent bilaterally. Normal anterior communicating artery complex. Right ACA widely patent. Short-segment moderate left A2 stenosis noted (series 8, image 79). No M1 stenosis or occlusion. Normal MCA bifurcations. Distal MCA branches well perfused and symmetric. Patent Posterior circulation: Both vertebral arteries patent to the vertebrobasilar junction without stenosis. Left vertebral artery  dominant. Both PICA origins patent and normal. Basilar patent to its distal aspect without stenosis. Superior cerebellar arteries patent bilaterally. Right PCA supplied via the basilar. Multifocal moderate stenoses noted involving the mid and distal right P2 segment. Fetal type origin of the left PCA. Left PCA well perfused to its distal aspect without stenosis. Venous sinuses: Patent allowing for timing of the contrast bolus. Anatomic variants: Fetal type origin of the left PCA. Dominant left vertebral artery. No intracranial aneurysm. Review of the MIP images confirms the above findings CT Brain Perfusion Findings: ASPECTS: 10 CBF (<30%) Volume: 0mL Perfusion (Tmax>6.0s) volume: 3mL Mismatch Volume: 3mL Infarction Location:No evidence for acute ischemia by CT perfusion. Apparent 3 cc perfusion deficit at the posterior right parietal vertex favored to be artifactual. No definite perfusion abnormality. IMPRESSION: CT HEAD IMPRESSION: 1. No acute intracranial abnormality identified. 2. Aspects = 10. 3. Age-related cerebral atrophy with moderate chronic microvascular ischemic disease. CTA HEAD AND NECK IMPRESSION: 1. Negative CTA for emergent large vessel occlusion. 2. Intracranial atherosclerotic disease with associated moderate  left A2 and right P2 stenoses. No other hemodynamically significant or correctable stenosis. 3. Diffuse tortuosity of the major arterial vasculature of the head and neck, suggesting chronic underlying hypertension. CT PERFUSION IMPRESSION: Negative CT perfusion. No evidence for acute core infarct or other perfusion abnormality. Critical Value/emergent results were called by telephone at the time of interpretation on 08/13/2020 at 11:25 pm and again at 11:40 pm to provider [facility], who verbally acknowledged these results. Electronically Signed   By: Rise Mu M.D.   On: 08/14/2020 00:17   CT ANGIO NECK CODE STROKE  Result Date: 08/14/2020 CLINICAL DATA:  Initial evaluation for acute aphasia. EXAM: CT ANGIOGRAPHY HEAD AND NECK CT PERFUSION BRAIN TECHNIQUE: Multidetector CT imaging of the head and neck was performed using the standard protocol during bolus administration of intravenous contrast. Multiplanar CT image reconstructions and MIPs were obtained to evaluate the vascular anatomy. Carotid stenosis measurements (when applicable) are obtained utilizing NASCET criteria, using the distal internal carotid diameter as the denominator. Multiphase CT imaging of the brain was performed following IV bolus contrast injection. Subsequent parametric perfusion maps were calculated using RAPID software. CONTRAST:  OMNIPAQUE IOHEXOL 350 MG/ML SOLN COMPARISON:  None available. FINDINGS: CT HEAD FINDINGS Brain: Generalized age-related cerebral atrophy with moderate chronic microvascular ischemic disease. No acute intracranial hemorrhage. No acute large vessel territory infarct. No mass lesion or midline shift. No hydrocephalus or extra-axial fluid collection. Vascular: No hyperdense vessel. Scattered vascular calcifications noted within the carotid siphons. Skull: No visible scalp soft tissue abnormality.  Calvarium intact. Sinuses/Orbits: Globes and orbital soft tissues demonstrate no acute finding.  Paranasal sinuses are largely clear. Sequelae of chronic left maxillary sinusitis noted. No mastoid effusion. Other: None. ASPECTS ([place]ber[place] Stroke Program Early CT Score) - Ganglionic level infarction (caudate, lentiform nuclei, internal capsule, insula, M1-M3 cortex): 7 - Supraganglionic infarction (M4-M6 cortex): 3 Total score (0-10 with 10 being normal): 10 Review of the MIP images confirms the above findings CTA NECK FINDINGS Aortic arch: Visualized aortic arch normal caliber with normal 3 vessel morphology. Mild atheromatous change about the arch itself. No hemodynamically significant stenosis about the origin the great vessels. Right carotid system: Right common and internal carotid arteries are diffusely tortuous but widely patent without stenosis, dissection or occlusion. Left carotid system: Left CCA tortuous but is widely patent to the bifurcation. Eccentric soft plaque at the proximal left ICA without significant stenosis. Left ICA mildly tortuous but widely patent distally  without stenosis, dissection or occlusion. Vertebral arteries: Both vertebral arteries arise from the subclavian arteries. No proximal subclavian artery stenosis. Left vertebral artery dominant. Vertebral arteries are tortuous but widely patent to the skull base without stenosis, dissection or occlusion. Skeleton: No visible acute osseous abnormality. Mild chronic height loss noted at the T4 vertebral body. No discrete or worrisome osseous lesions. Moderate spondylosis present at C4-5 through C6-7. Torus mandibularis noted. Other neck: No other acute soft tissue abnormality within the neck. No mass or adenopathy. Upper chest: Visualized upper chest demonstrates no acute finding. Review of the MIP images confirms the above findings CTA HEAD FINDINGS Anterior circulation: Petrous segments widely patent. Mild atheromatous change within the carotid siphons without hemodynamically significant stenosis. ICAs are somewhat dolichoectatic  through the carotid siphons. A1 segments patent bilaterally. Normal anterior communicating artery complex. Right ACA widely patent. Short-segment moderate left A2 stenosis noted (series 8, image 79). No M1 stenosis or occlusion. Normal MCA bifurcations. Distal MCA branches well perfused and symmetric. Patent Posterior circulation: Both vertebral arteries patent to the vertebrobasilar junction without stenosis. Left vertebral artery dominant. Both PICA origins patent and normal. Basilar patent to its distal aspect without stenosis. Superior cerebellar arteries patent bilaterally. Right PCA supplied via the basilar. Multifocal moderate stenoses noted involving the mid and distal right P2 segment. Fetal type origin of the left PCA. Left PCA well perfused to its distal aspect without stenosis. Venous sinuses: Patent allowing for timing of the contrast bolus. Anatomic variants: Fetal type origin of the left PCA. Dominant left vertebral artery. No intracranial aneurysm. Review of the MIP images confirms the above findings CT Brain Perfusion Findings: ASPECTS: 10 CBF (<30%) Volume: 28mL Perfusion (Tmax>6.0s) volume: 71mL Mismatch Volume: 10mL Infarction Location:No evidence for acute ischemia by CT perfusion. Apparent 3 cc perfusion deficit at the posterior right parietal vertex favored to be artifactual. No definite perfusion abnormality. IMPRESSION: CT HEAD IMPRESSION: 1. No acute intracranial abnormality identified. 2. Aspects = 10. 3. Age-related cerebral atrophy with moderate chronic microvascular ischemic disease. CTA HEAD AND NECK IMPRESSION: 1. Negative CTA for emergent large vessel occlusion. 2. Intracranial atherosclerotic disease with associated moderate left A2 and right P2 stenoses. No other hemodynamically significant or correctable stenosis. 3. Diffuse tortuosity of the major arterial vasculature of the head and neck, suggesting chronic underlying hypertension. CT PERFUSION IMPRESSION: Negative CT perfusion. No  evidence for acute core infarct or other perfusion abnormality. Critical Value/emergent results were called by telephone at the time of interpretation on 08/13/2020 at 11:25 pm and again at 11:40 pm to provider [facility], who verbally acknowledged these results. Electronically Signed   By: Rise Mu M.D.   On: 08/14/2020 00:17    Pertinent labs & imaging results that were available during my care of the patient were reviewed by me and considered in my medical decision making (see chart for details).  Medications Ordered in ED Medications  levETIRAcetam (KEPPRA) IVPB 1500 mg/ 100 mL premix (1,500 mg Intravenous New Bag/Given 08/14/20 0036)  ondansetron (ZOFRAN) 4 MG/2ML injection (4 mg  Given 08/13/20 2314)  iohexol (OMNIPAQUE) 350 MG/ML injection 100 mL (100 mLs Intravenous Contrast Given 08/13/20 2336)  diazepam (VALIUM) injection 2.5 mg (2.5 mg Intravenous Given 08/14/20 0029)  Procedures .1-3 Lead EKG Interpretation Performed by: Nira Conn, MD Authorized by: Nira Conn, MD     Interpretation: normal     ECG rate:  103   ECG rate assessment: tachycardic     Rhythm: sinus tachycardia     Ectopy: none     Conduction: normal      (including critical care time)  Medical Decision Making / ED Course I have reviewed the nursing notes for this encounter and the patient's prior records (if available in EHR or on provided paperwork).   Carla White was evaluated in Emergency Department on 08/14/2020 for the symptoms described in the history of present illness. She was evaluated in the context of the global COVID-19 pandemic, which necessitated consideration that the patient might be at risk for infection with the SARS-CoV-2 virus that causes COVID-19. Institutional protocols and algorithms that pertain to the evaluation of  patients at risk for COVID-19 are in a state of rapid change based on information released by regulatory bodies including the CDC and federal and state organizations. These policies and algorithms were followed during the patient's care in the ED.  Patient presented as a code stroke for word finding difficulty. Intermittent. Patient taken immediately to scanner. CT head without evidence of ICH or SAH. CT angio without evidence of severe occlusion. Neurology wants to rule out seizure versus cavernous sinus thrombosis.  Rest of the labs are grossly reassuring. She has mild leukocytosis. No anemia. No significant electrolyte derangements or renal sufficiency. COVID/influenza negative.  EKG reveals sinus tachycardia without ischemic changes. Troponin negative.  Neurology loaded patient with Keppra. She was also provided with a low-dose of Valium.  Will admit patient to medicine for further work-up and management.     Final Clinical Impression(s) / ED Diagnoses Final diagnoses:  None      This chart was dictated using voice recognition software.  Despite best efforts to proofread,  errors can occur which can change the documentation meaning.   Nira Conn, MD 08/14/20 [phone]

## 2020-08-14 ENCOUNTER — Observation Stay (HOSPITAL_COMMUNITY): Payer: PPO

## 2020-08-14 ENCOUNTER — Encounter (HOSPITAL_COMMUNITY): Payer: Self-pay | Admitting: Internal Medicine

## 2020-08-14 DIAGNOSIS — R519 Headache, unspecified: Secondary | ICD-10-CM | POA: Diagnosis present

## 2020-08-14 DIAGNOSIS — R4701 Aphasia: Secondary | ICD-10-CM | POA: Diagnosis present

## 2020-08-14 DIAGNOSIS — R4781 Slurred speech: Secondary | ICD-10-CM | POA: Diagnosis present

## 2020-08-14 DIAGNOSIS — I639 Cerebral infarction, unspecified: Secondary | ICD-10-CM | POA: Diagnosis not present

## 2020-08-14 DIAGNOSIS — Z0389 Encounter for observation for other suspected diseases and conditions ruled out: Secondary | ICD-10-CM | POA: Diagnosis not present

## 2020-08-14 LAB — TROPONIN I (HIGH SENSITIVITY)
Troponin I (High Sensitivity): 9 ng/L (ref <18)
Troponin I (High Sensitivity): 10 ng/L (ref <18)

## 2020-08-14 LAB — BASIC METABOLIC PANEL
Anion gap: 9 (ref 5–15)
BUN: 13 mg/dL (ref 8–23)
CO2: 27 mmol/L (ref 22–32)
Calcium: 9 mg/dL (ref 8.9–10.3)
Chloride: 97 mmol/L — ABNORMAL LOW (ref 98–111)
Creatinine, Ser: 1.03 mg/dL — ABNORMAL HIGH (ref 0.44–1.00)
GFR, Estimated: 54 mL/min — ABNORMAL LOW (ref >60)
Glucose, Bld: 107 mg/dL — ABNORMAL HIGH (ref 70–99)
Potassium: 4.1 mmol/L (ref 3.5–5.1)
Sodium: 133 mmol/L — ABNORMAL LOW (ref 135–145)

## 2020-08-14 LAB — CBC
HCT: 34.3 % — ABNORMAL LOW (ref 36.0–46.0)
Hemoglobin: 11.9 g/dL — ABNORMAL LOW (ref 12.0–15.0)
MCH: 33.5 pg (ref 26.0–34.0)
MCHC: 34.7 g/dL (ref 30.0–36.0)
MCV: 96.6 fL (ref 80.0–100.0)
Platelets: 337 10*3/uL (ref 150–400)
RBC: 3.55 MIL/uL — ABNORMAL LOW (ref 3.87–5.11)
RDW: 13.1 % (ref 11.5–15.5)
WBC: 9.2 10*3/uL (ref 4.0–10.5)
nRBC: 0 % (ref 0.0–0.2)

## 2020-08-14 LAB — URINALYSIS, ROUTINE W REFLEX MICROSCOPIC
Bilirubin Urine: NEGATIVE
Glucose, UA: NEGATIVE mg/dL
Hgb urine dipstick: NEGATIVE
Ketones, ur: 20 mg/dL — AB
Leukocytes,Ua: NEGATIVE
Nitrite: NEGATIVE
Protein, ur: NEGATIVE mg/dL
Specific Gravity, Urine: >1.046 — ABNORMAL HIGH (ref 1.005–1.030)
pH: 7 (ref 5.0–8.0)

## 2020-08-14 LAB — RAPID URINE DRUG SCREEN, HOSP PERFORMED
Amphetamines: NOT DETECTED
Barbiturates: NOT DETECTED
Benzodiazepines: NOT DETECTED
Cocaine: NOT DETECTED
Opiates: NOT DETECTED
Tetrahydrocannabinol: NOT DETECTED

## 2020-08-14 LAB — RESP PANEL BY RT-PCR (FLU A&B, COVID) ARPGX2
Influenza A by PCR: NEGATIVE
Influenza B by PCR: NEGATIVE
SARS Coronavirus 2 by RT PCR: NEGATIVE

## 2020-08-14 LAB — MAGNESIUM: Magnesium: 2 mg/dL (ref 1.7–2.4)

## 2020-08-14 IMAGING — MR MR [PERSON_NAME] HEAD
3 series · 43 of 48 positions shown · non-contrast
Comparison: Brain MRI performed at the same time

CLINICAL DATA: Dural sinus thrombosis suspected.

EXAM:
MR VENOGRAM HEAD WITHOUT CONTRAST
TECHNIQUE: Angiographic images of the intracranial venous structures were
acquired using MRV technique without intravenous contrast.

[Series 22: tof_fl2d_paracor · coronal · 2.0mm · 0.98mm/px · 12 of 130 slices shown]
[im 1/130]
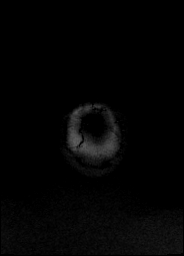
[im 12/130]
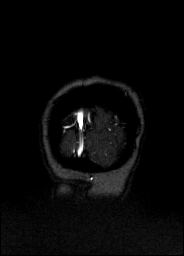
[im 24/130]
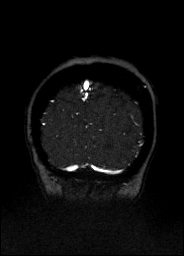
[im 36/130]
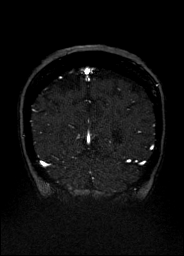
[im 47/130]
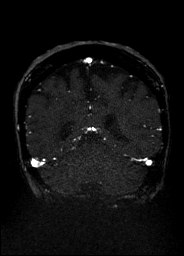
[im 59/130]
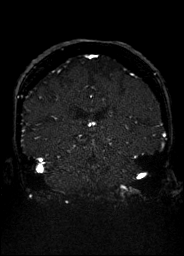
[im 71/130]
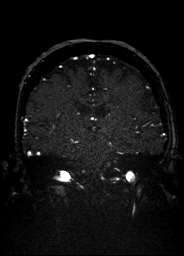
[im 83/130]
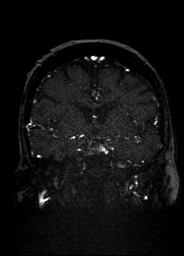
[im 94/130]
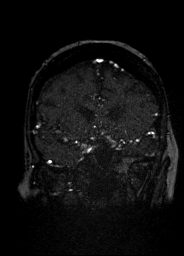
[im 106/130]
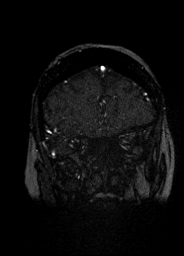
[im 118/130]
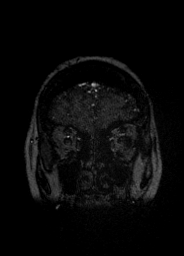
[im 130/130]
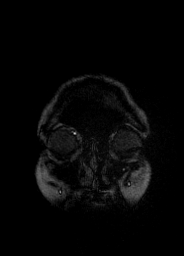

[Series 29: venous inhance coronal · coronal · portal-venous · 0.9mm · 0.57mm/px · 18 of 192 slices shown]
[im 1/192]
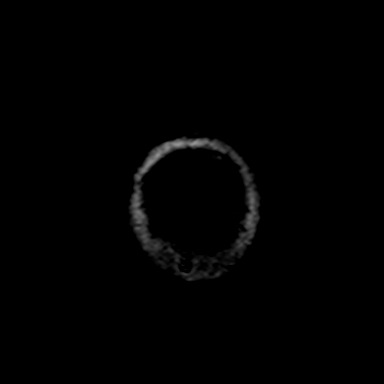
[im 12/192]
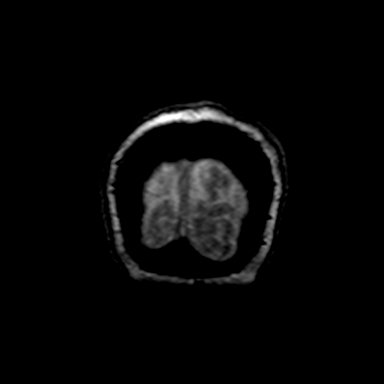
[im 23/192]
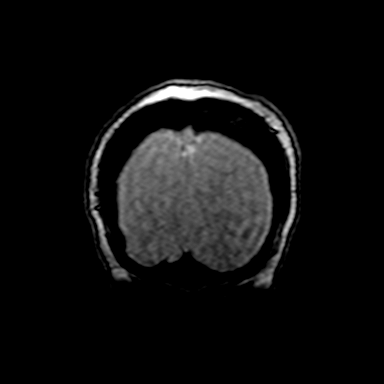
[im 34/192]
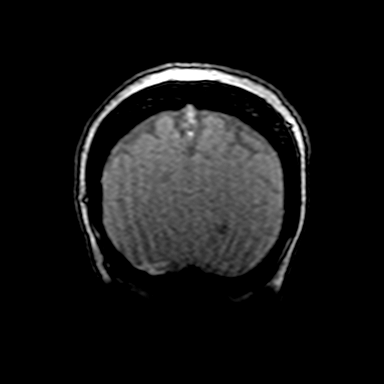
[im 45/192]
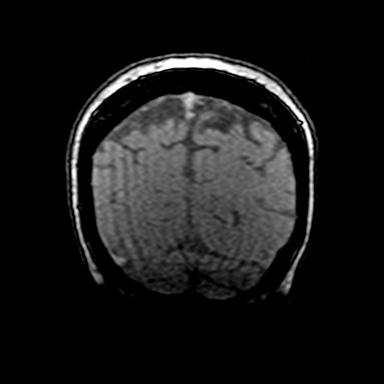
[im 57/192]
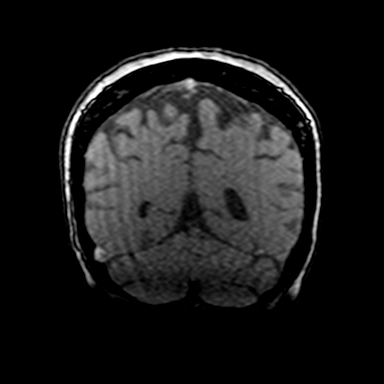
[im 68/192]
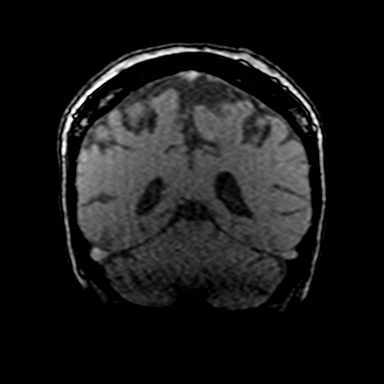
[im 79/192]
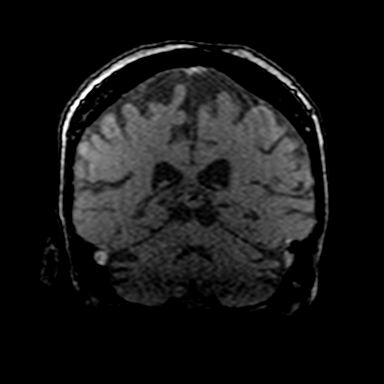
[im 90/192]
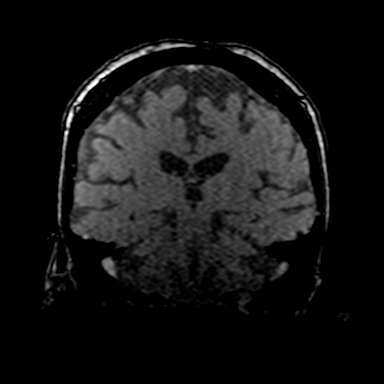
[im 102/192]
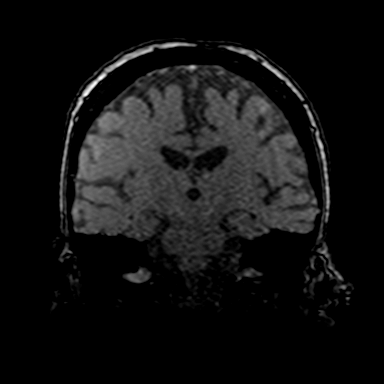
[im 113/192]
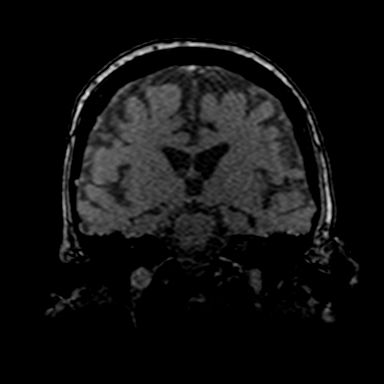
[im 124/192]
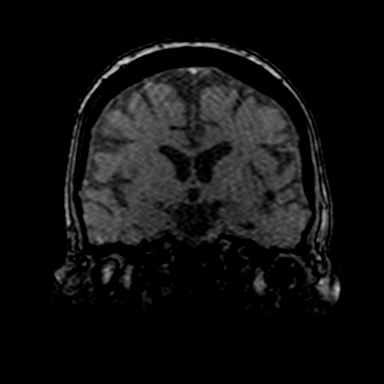
[im 135/192]
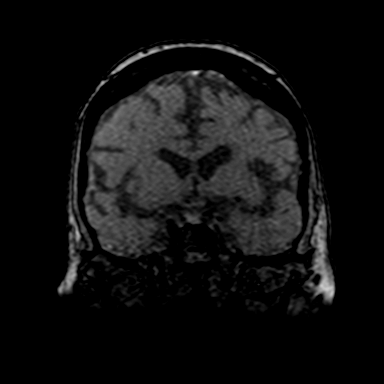
[im 147/192]
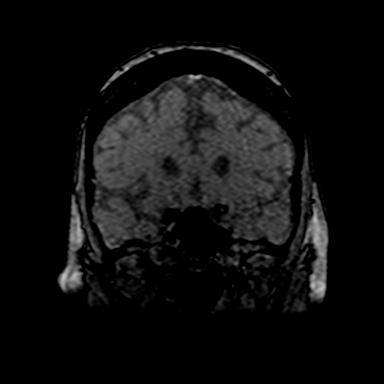
[im 158/192]
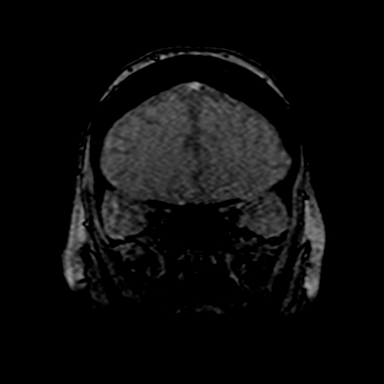
[im 169/192]
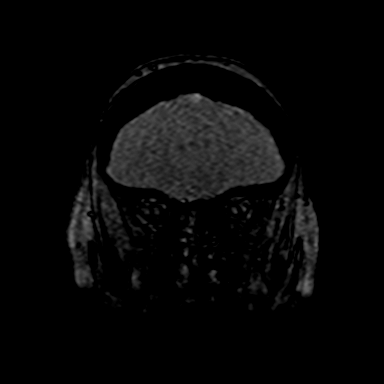
[im 180/192]
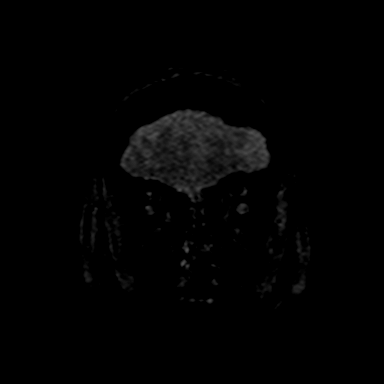
[im 192/192]
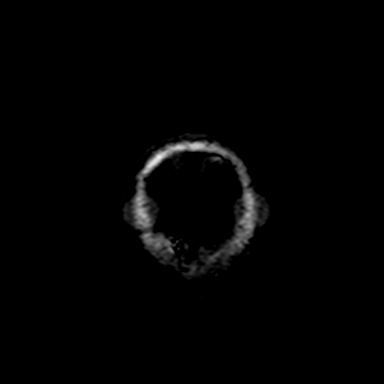

[Series 30: venous inhance coronal_msum · coronal · portal-venous · 0.9mm · 0.57mm/px · 13 of 192 slices shown]
[im 1/192]
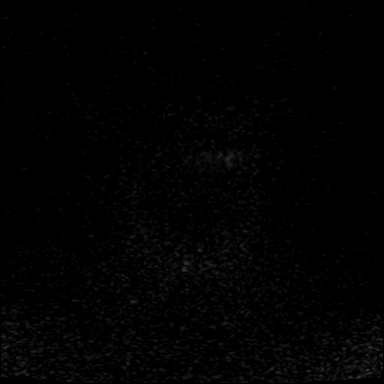
[im 12/192]
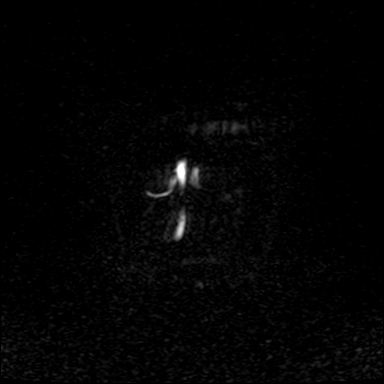
[im 23/192]
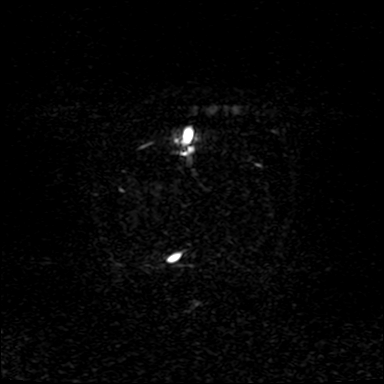
[im 34/192]
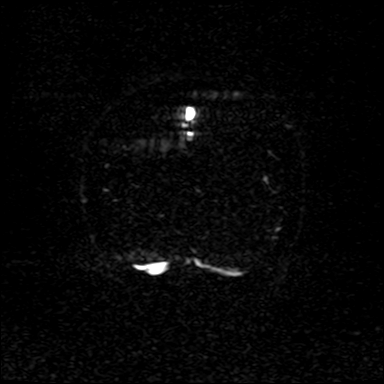
[im 45/192]
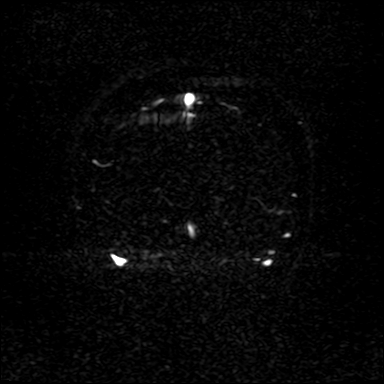
[im 57/192]
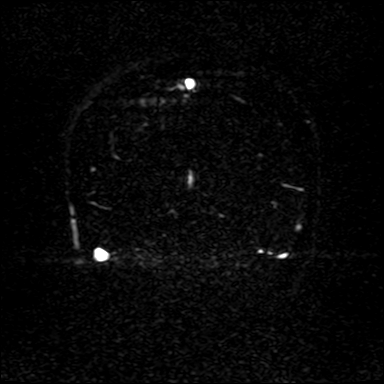
[im 79/192]
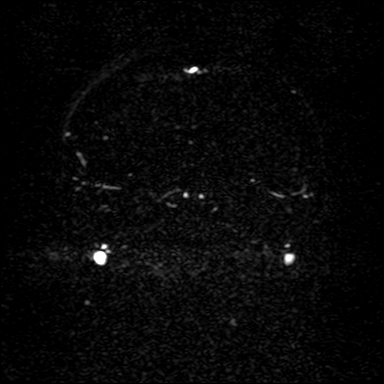
[im 102/192]
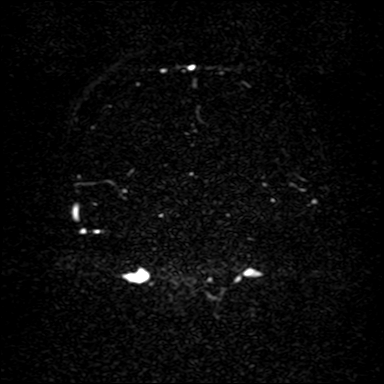
[im 113/192]
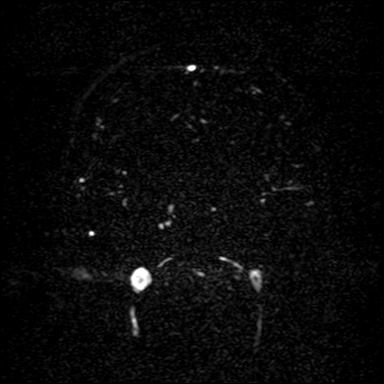
[im 135/192]
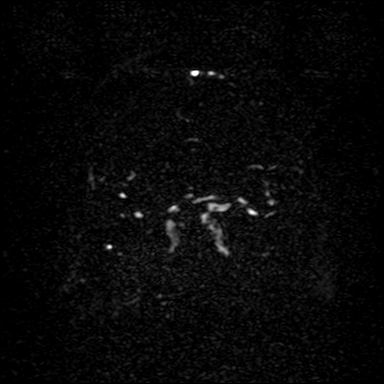
[im 158/192]
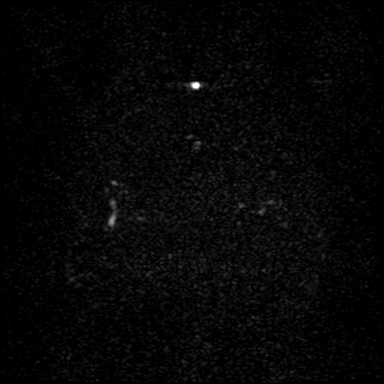
[im 169/192]
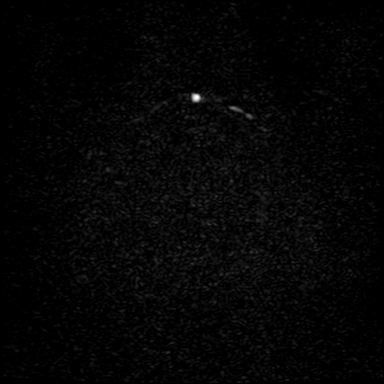
[im 180/192]
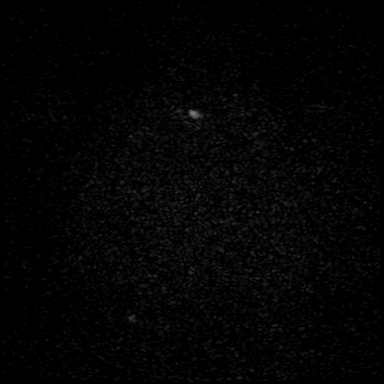

[43 of 48 positions shown; findings below may reference images not displayed]

FINDINGS: Patent dural sinuses and symmetric deep veins. No evidence of
cortical vein thrombosis.
IMPRESSION: Negative for dural sinus thrombosis.

## 2020-08-14 IMAGING — MR MR HEAD W/O CM
13 of 14 series · 44 of 48 positions shown · non-contrast
Comparison: Head CT and CTA from yesterday

CLINICAL DATA: Acute stroke suspected

EXAM:
MRI HEAD WITHOUT CONTRAST
TECHNIQUE: Multiplanar, multiecho pulse sequences of the brain and surrounding
structures were obtained without intravenous contrast.

[Series 5: DWI · axial · 3.0mm · 0.88mm/px · z∈[-48,+99]mm · 7 of 104 slices shown (1 of 4)]
[im 1/104]
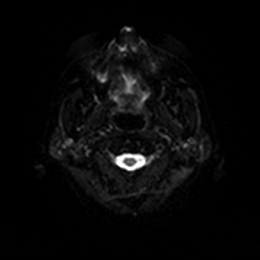
[im 18/104]
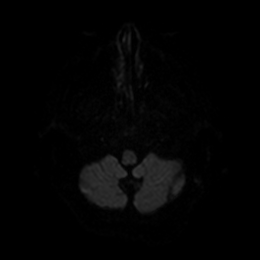
[im 35/104]
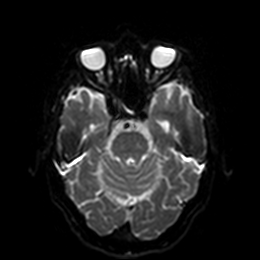
[im 52/104]
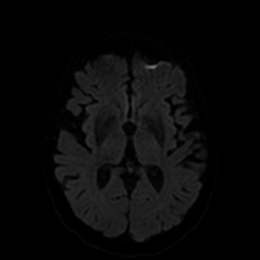
[im 69/104]
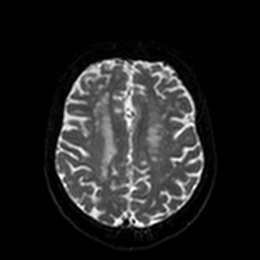
[im 86/104]
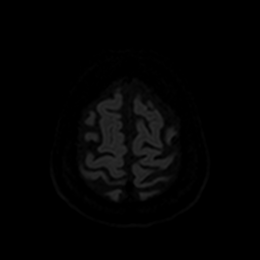
[im 104/104]
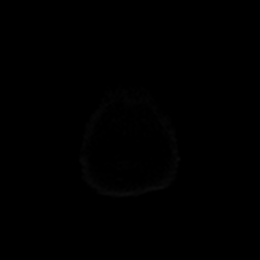

[Series 6: DWI · axial · 3.0mm · 0.88mm/px · z∈[-48,+99]mm · 3 of 52 slices shown (2 of 4)]
[im 1/52]
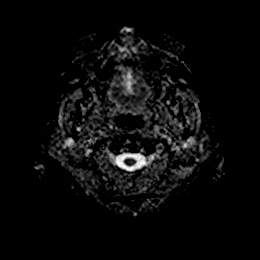
[im 26/52]
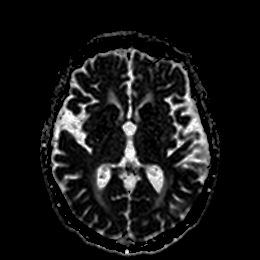
[im 52/52]
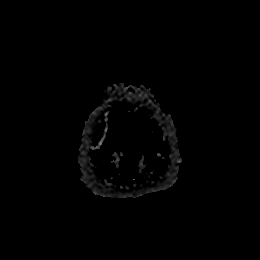

[Series 7: DWI · coronal · 4.0mm · 0.88mm/px · 4 of 70 slices shown (3 of 4)]
[im 1/70]
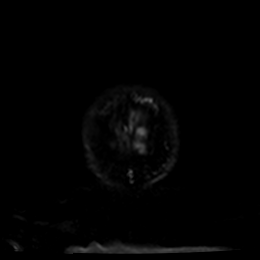
[im 24/70]
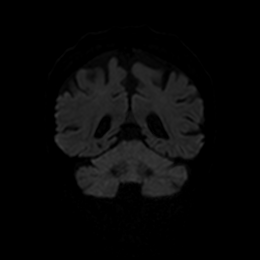
[im 47/70]
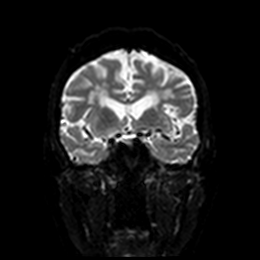
[im 70/70]
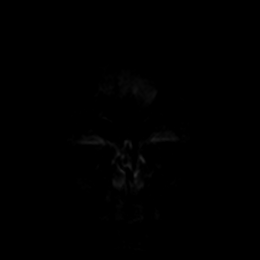

[Series 8: DWI · coronal · 4.0mm · 0.88mm/px · 2 of 35 slices shown (4 of 4)]
[im 1/35]
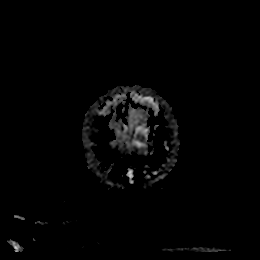
[im 35/35]
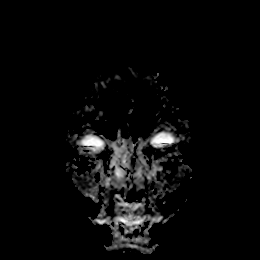

[Series 9: T1 · sagittal · 5.0mm · 0.75mm/px · 2 of 26 slices shown]
[im 1/26]
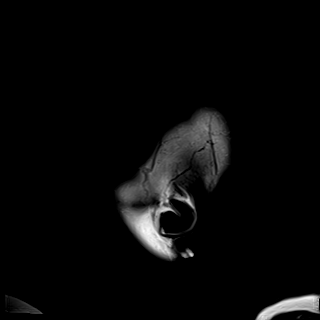
[im 26/26]
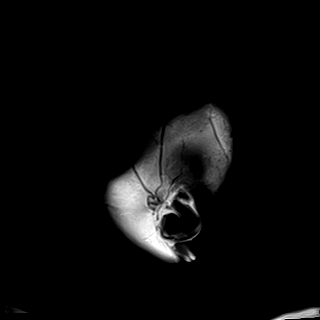

[Series 12: mag_images · axial · 3.0mm · 0.90mm/px · z∈[-53,+94]mm · 3 of 52 slices shown]
[im 1/52]
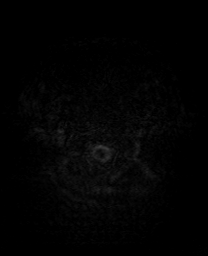
[im 26/52]
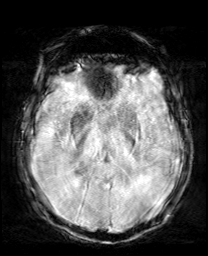
[im 52/52]
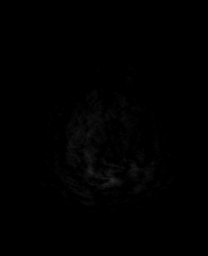

[Series 13: pha_images · axial · 3.0mm · 0.90mm/px · z∈[-47,+94]mm · 3 of 50 slices shown]
[im 1/50]
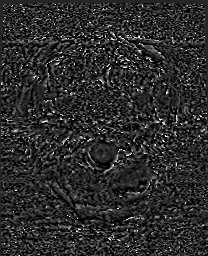
[im 25/50]
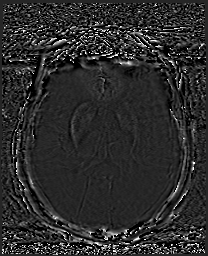
[im 50/50]
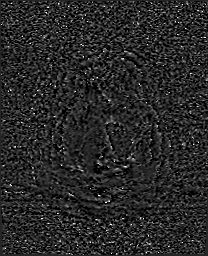

[Series 14: swi_images · axial · 3.0mm · 0.90mm/px · z∈[-53,+94]mm · 3 of 52 slices shown]
[im 1/52]
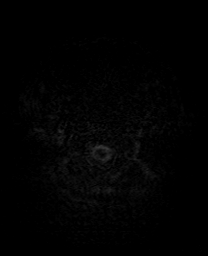
[im 26/52]
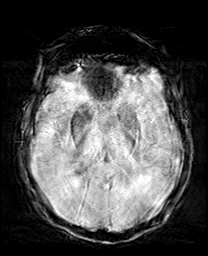
[im 52/52]
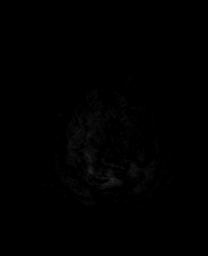

[Series 15: mip_images(sw) · axial · 24.0mm · 0.90mm/px · z∈[-43,+84]mm · 3 of 45 slices shown]
[im 1/45]
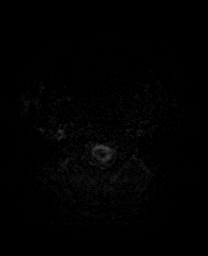
[im 23/45]
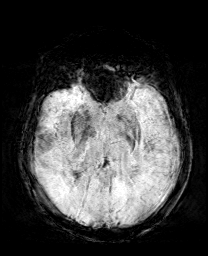
[im 45/45]
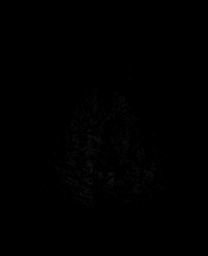

[Series 21: T2 · coronal · 5.0mm · 0.34mm/px · 2 of 29 slices shown (1 of 2)]
[im 1/29]
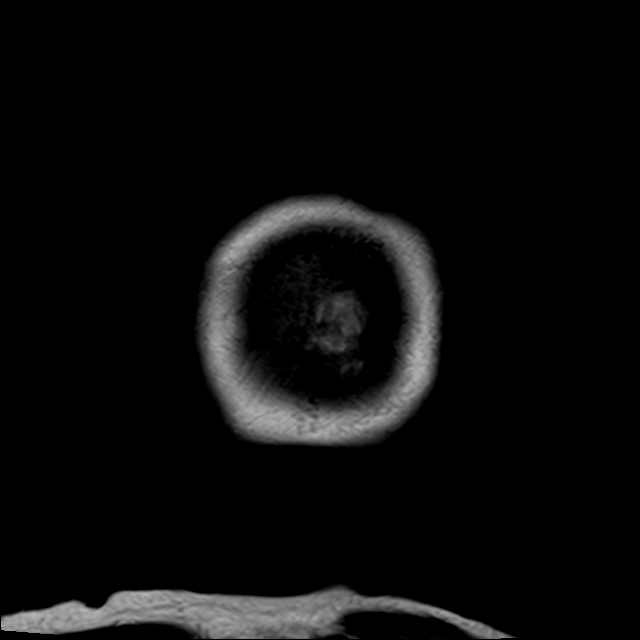
[im 29/29]
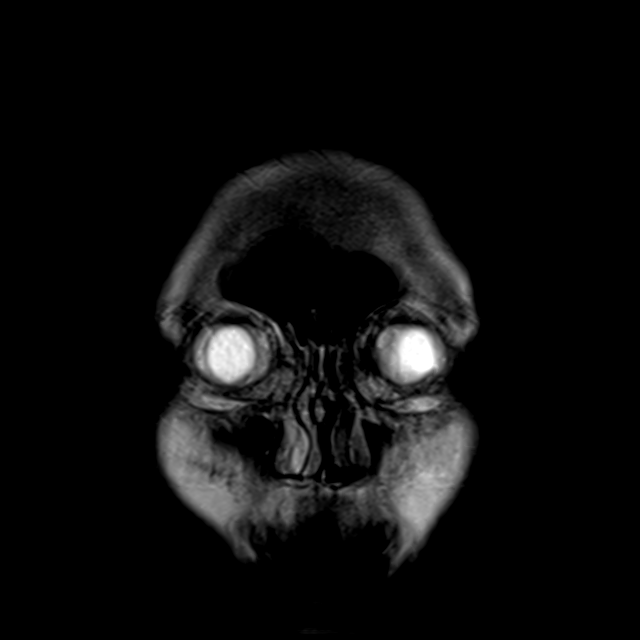

[Series 22: tof_fl2d_paracor · coronal · 2.0mm · 0.98mm/px · 8 of 130 slices shown]
[im 1/130]
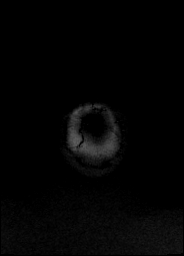
[im 19/130]
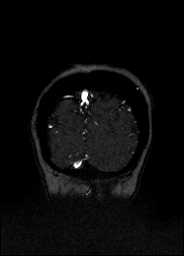
[im 37/130]
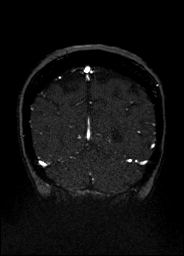
[im 56/130]
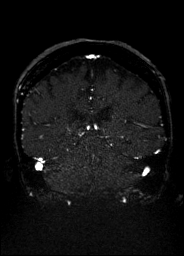
[im 74/130]
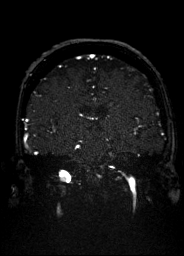
[im 93/130]
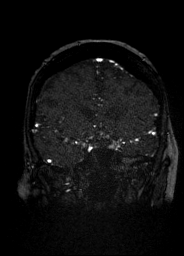
[im 111/130]
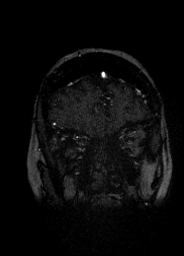
[im 130/130]
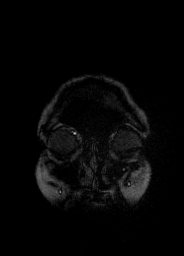

[Series 27: FLAIR · axial · 5.0mm · 0.45mm/px · z∈[-68,+86]mm · 2 of 28 slices shown]
[im 1/28]
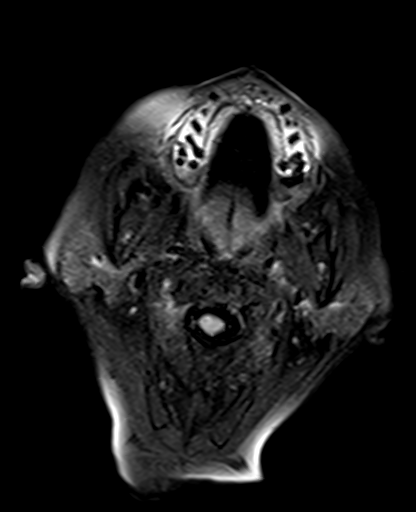
[im 28/28]
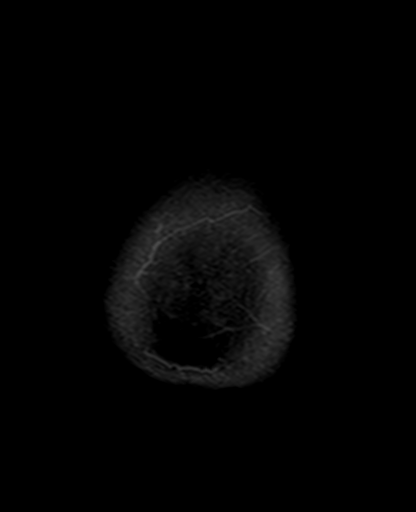

[Series 28: T2 · axial · 5.0mm · 0.72mm/px · z∈[-69,+84]mm · 2 of 28 slices shown (2 of 2)]
[im 1/28]
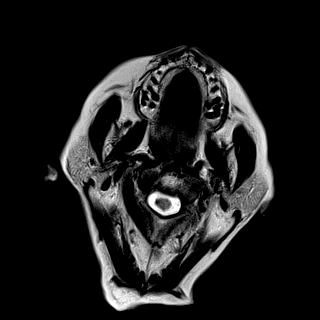
[im 28/28]
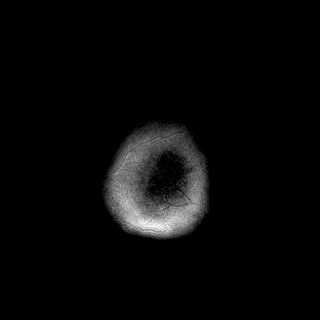

[44 of 48 positions shown; findings below may reference images not displayed]

FINDINGS: Brain: No acute infarction, hemorrhage, hydrocephalus, extra-axial
collection or mass lesion. Chronic small vessel ischemia in the
cerebral white matter that is moderate to extensive. Age normal
brain volume. Small remote right cerebellar infarct.

Vascular: Preserved flow voids.  There was preceding CTA.

Skull and upper cervical spine: Normal marrow signal

Sinuses/Orbits: Bilateral cataract resection.

Other: Progressively motion degraded.
IMPRESSION: 1. Motion degraded brain MRI without acute finding.
2. Extensive chronic small vessel ischemia.

## 2020-08-14 MED ORDER — OXYCODONE HCL 5 MG PO TABS: 5.0000 mg | ORAL_TABLET | Freq: Four times a day (QID) | ORAL | Status: DC | PRN

## 2020-08-14 MED ORDER — LEVETIRACETAM IN NACL 500 MG/100ML IV SOLN
500.0000 mg | Freq: Two times a day (BID) | INTRAVENOUS | Status: DC
  Administered 2020-08-14 – 2020-08-18 (×8): 500 mg via INTRAVENOUS
  Filled 2020-08-14 (×8): qty 100

## 2020-08-14 MED ORDER — PROCHLORPERAZINE EDISYLATE 10 MG/2ML IJ SOLN: 10.0000 mg | Freq: Four times a day (QID) | INTRAMUSCULAR | Status: DC | PRN

## 2020-08-14 MED ORDER — DIAZEPAM 5 MG/ML IJ SOLN
2.5000 mg | Freq: Once | INTRAMUSCULAR | Status: AC
  Administered 2020-08-14: 2.5 mg via INTRAVENOUS
  Filled 2020-08-14: qty 2

## 2020-08-14 MED ORDER — SODIUM CHLORIDE 0.9 % IV SOLN: INTRAVENOUS | Status: AC

## 2020-08-14 MED ORDER — ONDANSETRON HCL 4 MG/2ML IJ SOLN
4.0000 mg | Freq: Four times a day (QID) | INTRAMUSCULAR | Status: DC | PRN
  Administered 2020-08-14: 4 mg via INTRAVENOUS
  Filled 2020-08-14: qty 2

## 2020-08-14 MED ORDER — ACETAMINOPHEN 325 MG PO TABS
650.0000 mg | ORAL_TABLET | ORAL | Status: DC | PRN
  Administered 2020-08-14 (×3): 650 mg via ORAL
  Filled 2020-08-14 (×3): qty 2

## 2020-08-14 MED ORDER — LEVETIRACETAM IN NACL 1500 MG/100ML IV SOLN
1500.0000 mg | Freq: Once | INTRAVENOUS | Status: AC
  Administered 2020-08-14: 1500 mg via INTRAVENOUS
  Filled 2020-08-14: qty 100

## 2020-08-14 NOTE — ED Notes (Signed)
Md cardama notified of pt passing swallow screen

## 2020-08-14 NOTE — Code Documentation (Signed)
Paged for Code Stroke Patient presents with aphasia headache and vomiting that has been waxing and waning since around lunch time.    Patient has recent fall but is not on thinners.   Patient had change while en route with ems which is reason stroke with paged out  After CT patient returned to baseline.  While in CT patient was aphasic and dry heaving.   CT neg and CTA/CTP negative

## 2020-08-14 NOTE — Plan of Care (Addendum)
Neurology Plan of Care  Patient reassessed with continued episodes of aphasia. Patient family members at bedside state that she has had continued aphasia intermittently throughout the day.  Will initiate LTM EEG and start maint LEV 500mg  bid.  Neurology will continue to follow.  Plan of care discussed with attending MD, Dr. , and she is in agreement. Selina Cooley, AGAC-NP [facility] Pager: [phone]

## 2020-08-14 NOTE — Progress Notes (Signed)
Pt arrived back from MRI via bed alert and oriented x4. Pt remains sleepy and continues to want to sleep. No neuro changes noted from previous assessment. Bed alarm activated and in lowest position. Will cont to monitor.

## 2020-08-14 NOTE — H&P (Signed)
History and Physical    Carla White DZH:[id number] DOB: November 09, 1935 DOA: 08/13/2020  PCP: Lupita Raider, MD  Patient coming from: Home.  Chief Complaint: Headache nausea vomiting and difficulty to communicate.  HPI: Carla White is a 85 y.o. female with no significant past medical history started experiencing headache which was bifrontal at the workplace yesterday morning.  This was followed by multiple episodes of vomiting an hour later.  No abdominal pain or diarrhea.  When patient was trying to talk to her daughter she was not able to get the correct words and this happened multiple times.  Patient was brought to the ER.  ED Course: In the ER patient had stroke work-up done with CT head perfusion study and angio head and neck which did not show any acute findings.  Neurologist on-call at this time felt that patient may have possible seizure for which patient was given Keppra and admitted for further observation and MRI brain and MRV brain has been ordered.  Labs show mild leukocytosis.  Creatinine of 1.04 COVID test was negative EKG shows normal sinus rhythm.  Review of Systems: As per HPI, rest all negative.   History reviewed. No pertinent past medical history.  History reviewed. No pertinent surgical history.   reports that she has never smoked. She has never used smokeless tobacco. No history on file for alcohol use and drug use.  Allergies  Allergen Reactions   Claritin-D 24 Hour [Loratadine-Pseudoephedrine Er] Nausea Only   Codeine Nausea Only    Other reaction(s): stomach upset   Darvon [Propoxyphene]     Other reaction(s): stomach upset   Fexofenadine     Other reaction(s): nausea   Loratadine     Other reaction(s): nausea   Penicillin G     Other reaction(s): rash   Septra [Sulfamethoxazole-Trimethoprim]     Other reaction(s): severe vomiting    Family History  Family history unknown: Yes    Prior to Admission medications   Medication Sig Start  Date End Date Taking? Authorizing Provider  Cyanocobalamin (VITAMIN B12 PO) Take 1 tablet by mouth daily.   Yes [provider]  GLUCOSAMINE HCL PO Take 1 capsule by mouth daily.   Yes [provider]  Magnesium 200 MG TABS Take 200 mg by mouth daily.   Yes [provider]  meclizine (ANTIVERT) 25 MG tablet Take 25-50 mg by mouth 3 (three) times daily as needed for dizziness or nausea. 08/31/12  Yes [provider]  Multiple Vitamin (MULTIVITAMIN) tablet Take 1 tablet by mouth daily.   Yes [provider]  Turmeric 500 MG CAPS Take 500 mg by mouth daily.   Yes [provider]  vitamin E 45 MG (100 UNITS) capsule Take 100 Units by mouth daily.   Yes [provider]    Physical Exam: Constitutional: Moderately built and nourished. Vitals:   08/14/20 0000 08/14/20 0015 08/14/20 0030 08/14/20 0045  BP: (!) 158/75 (!) 161/70 (!) 83/73 (!) 145/85  Pulse: 97 94 99 95  Resp: 18 (!) 25 (!) 27 19  Temp:      TempSrc:      SpO2: 100% 100% 100% 97%  Weight:      Height:       Eyes: Anicteric no pallor. ENMT: No discharge from the ears eyes nose and mouth. Neck: No mass felt.  No neck rigidity. Respiratory: No rhonchi or crepitations. Cardiovascular: S1-S2 heard. Abdomen: Soft nontender bowel sounds present. Musculoskeletal: No edema. Skin: No rash.  Neurologic: Alert awake oriented time place and person.  Moves all extremities 5 x 5.  No facial asymmetry tongue is midline.  Pupils equal and reacting to light. Psychiatric: Appears normal.  Normal affect.   Labs on Admission: I have personally reviewed following labs and imaging studies  CBC: Recent Labs  Lab 08/13/20 2306 08/13/20 2314  WBC 11.2*  --   NEUTROABS 8.7*  --   HGB 13.0 13.3  HCT 38.4 39.0  MCV 95.5  --   PLT 352  --    Basic Metabolic Panel: Recent Labs  Lab 08/13/20 2306 08/13/20 2314 08/13/20 2320  NA 135 135  --   K 4.3 4.3  --   CL 100 100  --    CO2 25  --   --   GLUCOSE 146* 142*  --   BUN 16 17  --   CREATININE 1.04* 0.90  --   CALCIUM 9.1  --   --   MG  --   --  2.0   GFR: Estimated Creatinine Clearance: 43.4 mL/min (by C-G formula based on SCr of 0.9 mg/dL). Liver Function Tests: Recent Labs  Lab 08/13/20 2306  AST 28  ALT 21  ALKPHOS 102  BILITOT 0.8  PROT 7.0  ALBUMIN 3.9   No results for input(s): LIPASE, AMYLASE in the last 168 hours. No results for input(s): AMMONIA in the last 168 hours. Coagulation Profile: Recent Labs  Lab 08/13/20 2306  INR 1.1   Cardiac Enzymes: No results for input(s): CKTOTAL, CKMB, CKMBINDEX, TROPONINI in the last 168 hours. BNP (last 3 results) No results for input(s): PROBNP in the last 8760 hours. HbA1C: No results for input(s): HGBA1C in the last 72 hours. CBG: Recent Labs  Lab 08/13/20 2307  GLUCAP 138*   Lipid Profile: No results for input(s): CHOL, HDL, LDLCALC, TRIG, CHOLHDL, LDLDIRECT in the last 72 hours. Thyroid Function Tests: No results for input(s): TSH, T4TOTAL, FREET4, T3FREE, THYROIDAB in the last 72 hours. Anemia Panel: No results for input(s): VITAMINB12, FOLATE, FERRITIN, TIBC, IRON, RETICCTPCT in the last 72 hours. Urine analysis: No results found for: COLORURINE, APPEARANCEUR, LABSPEC, PHURINE, GLUCOSEU, HGBUR, BILIRUBINUR, KETONESUR, PROTEINUR, UROBILINOGEN, NITRITE, LEUKOCYTESUR Sepsis Labs: (procalcitonin:4,lacticidven:4) ) Recent Results (from the past 240 hour(s))  Resp Panel by RT-PCR (Flu A&B, Covid) Nasopharyngeal Swab     Status: None   Collection Time: 08/13/20 11:06 PM   Specimen: Nasopharyngeal Swab; Nasopharyngeal(NP) swabs in vial transport medium  Result Value Ref Range Status   SARS Coronavirus 2 by RT PCR NEGATIVE NEGATIVE Final    Comment: (NOTE) SARS-CoV-2 target nucleic acids are NOT DETECTED.  The SARS-CoV-2 RNA is generally detectable in upper respiratory specimens during the acute phase of infection. The  lowest concentration of SARS-CoV-2 viral copies this assay can detect is 138 copies/mL. A negative result does not preclude SARS-Cov-2 infection and should not be used as the sole basis for treatment or other patient management decisions. A negative result may occur with  improper specimen collection/handling, submission of specimen other than nasopharyngeal swab, presence of viral mutation(s) within the areas targeted by this assay, and inadequate number of viral copies(<138 copies/mL). A negative result must be combined with clinical observations, patient history, and epidemiological information. The expected result is Negative.  Fact Sheet for Patients:  BloggerCourse.com  Fact Sheet for Healthcare Providers:  SeriousBroker.it  This test is no t yet approved or cleared by the [place] FDA and  has been authorized for detection and/or diagnosis  of SARS-CoV-2 by FDA under an Emergency Use Authorization (EUA). This EUA will remain  in effect (meaning this test can be used) for the duration of the COVID-19 declaration under Section 564(b)(1) of the Act, 21 U.S.C.section 360bbb-3(b)(1), unless the authorization is terminated  or revoked sooner.       Influenza A by PCR NEGATIVE NEGATIVE Final   Influenza B by PCR NEGATIVE NEGATIVE Final    Comment: (NOTE) The Xpert Xpress SARS-CoV-2/FLU/RSV plus assay is intended as an aid in the diagnosis of influenza from Nasopharyngeal swab specimens and should not be used as a sole basis for treatment. Nasal washings and aspirates are unacceptable for Xpert Xpress SARS-CoV-2/FLU/RSV testing.  Fact Sheet for Patients: BloggerCourse.com  Fact Sheet for Healthcare Providers: SeriousBroker.it  This test is not yet approved or cleared by the [place] FDA and has been authorized for detection and/or diagnosis of SARS-CoV-2 by FDA under  an Emergency Use Authorization (EUA). This EUA will remain in effect (meaning this test can be used) for the duration of the COVID-19 declaration under Section 564(b)(1) of the Act, 21 U.S.C. section 360bbb-3(b)(1), unless the authorization is terminated or revoked.  Performed at [facility], [address], [place], [place] [address]      Radiological Exams on Admission: CT CEREBRAL PERFUSION W CONTRAST  Result Date: 08/14/2020 CLINICAL DATA:  Initial evaluation for acute aphasia. EXAM: CT ANGIOGRAPHY HEAD AND NECK CT PERFUSION BRAIN TECHNIQUE: Multidetector CT imaging of the head and neck was performed using the standard protocol during bolus administration of intravenous contrast. Multiplanar CT image reconstructions and MIPs were obtained to evaluate the vascular anatomy. Carotid stenosis measurements (when applicable) are obtained utilizing NASCET criteria, using the distal internal carotid diameter as the denominator. Multiphase CT imaging of the brain was performed following IV bolus contrast injection. Subsequent parametric perfusion maps were calculated using RAPID software. CONTRAST:  OMNIPAQUE IOHEXOL 350 MG/ML SOLN COMPARISON:  None available. FINDINGS: CT HEAD FINDINGS Brain: Generalized age-related cerebral atrophy with moderate chronic microvascular ischemic disease. No acute intracranial hemorrhage. No acute large vessel territory infarct. No mass lesion or midline shift. No hydrocephalus or extra-axial fluid collection. Vascular: No hyperdense vessel. Scattered vascular calcifications noted within the carotid siphons. Skull: No visible scalp soft tissue abnormality.  Calvarium intact. Sinuses/Orbits: Globes and orbital soft tissues demonstrate no acute finding. Paranasal sinuses are largely clear. Sequelae of chronic left maxillary sinusitis noted. No mastoid effusion. Other: None. ASPECTS (Alberta Stroke Program Early CT Score) - Ganglionic level infarction (caudate,  lentiform nuclei, internal capsule, insula, M1-M3 cortex): 7 - Supraganglionic infarction (M4-M6 cortex): 3 Total score (0-10 with 10 being normal): 10 Review of the MIP images confirms the above findings CTA NECK FINDINGS Aortic arch: Visualized aortic arch normal caliber with normal 3 vessel morphology. Mild atheromatous change about the arch itself. No hemodynamically significant stenosis about the origin the great vessels. Right carotid system: Right common and internal carotid arteries are diffusely tortuous but widely patent without stenosis, dissection or occlusion. Left carotid system: Left CCA tortuous but is widely patent to the bifurcation. Eccentric soft plaque at the proximal left ICA without significant stenosis. Left ICA mildly tortuous but widely patent distally without stenosis, dissection or occlusion. Vertebral arteries: Both vertebral arteries arise from the subclavian arteries. No proximal subclavian artery stenosis. Left vertebral artery dominant. Vertebral arteries are tortuous but widely patent to the skull base without stenosis, dissection or occlusion. Skeleton: No visible acute osseous abnormality. Mild chronic height loss noted  at the T4 vertebral body. No discrete or worrisome osseous lesions. Moderate spondylosis present at C4-5 through C6-7. Torus mandibularis noted. Other neck: No other acute soft tissue abnormality within the neck. No mass or adenopathy. Upper chest: Visualized upper chest demonstrates no acute finding. Review of the MIP images confirms the above findings CTA HEAD FINDINGS Anterior circulation: Petrous segments widely patent. Mild atheromatous change within the carotid siphons without hemodynamically significant stenosis. ICAs are somewhat dolichoectatic through the carotid siphons. A1 segments patent bilaterally. Normal anterior communicating artery complex. Right ACA widely patent. Short-segment moderate left A2 stenosis noted (series 8, image 79). No M1 stenosis  or occlusion. Normal MCA bifurcations. Distal MCA branches well perfused and symmetric. Patent Posterior circulation: Both vertebral arteries patent to the vertebrobasilar junction without stenosis. Left vertebral artery dominant. Both PICA origins patent and normal. Basilar patent to its distal aspect without stenosis. Superior cerebellar arteries patent bilaterally. Right PCA supplied via the basilar. Multifocal moderate stenoses noted involving the mid and distal right P2 segment. Fetal type origin of the left PCA. Left PCA well perfused to its distal aspect without stenosis. Venous sinuses: Patent allowing for timing of the contrast bolus. Anatomic variants: Fetal type origin of the left PCA. Dominant left vertebral artery. No intracranial aneurysm. Review of the MIP images confirms the above findings CT Brain Perfusion Findings: ASPECTS: 10 CBF (<30%) Volume: 81mL Perfusion (Tmax>6.0s) volume: 53mL Mismatch Volume: 38mL Infarction Location:No evidence for acute ischemia by CT perfusion. Apparent 3 cc perfusion deficit at the posterior right parietal vertex favored to be artifactual. No definite perfusion abnormality. IMPRESSION: CT HEAD IMPRESSION: 1. No acute intracranial abnormality identified. 2. Aspects = 10. 3. Age-related cerebral atrophy with moderate chronic microvascular ischemic disease. CTA HEAD AND NECK IMPRESSION: 1. Negative CTA for emergent large vessel occlusion. 2. Intracranial atherosclerotic disease with associated moderate left A2 and right P2 stenoses. No other hemodynamically significant or correctable stenosis. 3. Diffuse tortuosity of the major arterial vasculature of the head and neck, suggesting chronic underlying hypertension. CT PERFUSION IMPRESSION: Negative CT perfusion. No evidence for acute core infarct or other perfusion abnormality. Critical Value/emergent results were called by telephone at the time of interpretation on 08/13/2020 at 11:25 pm and again at 11:40 pm to provider  [facility], who verbally acknowledged these results. Electronically Signed   By: Rise Mu M.D.   On: 08/14/2020 00:17   CT HEAD CODE STROKE WO CONTRAST  Result Date: 08/14/2020 CLINICAL DATA:  Initial evaluation for acute aphasia. EXAM: CT ANGIOGRAPHY HEAD AND NECK CT PERFUSION BRAIN TECHNIQUE: Multidetector CT imaging of the head and neck was performed using the standard protocol during bolus administration of intravenous contrast. Multiplanar CT image reconstructions and MIPs were obtained to evaluate the vascular anatomy. Carotid stenosis measurements (when applicable) are obtained utilizing NASCET criteria, using the distal internal carotid diameter as the denominator. Multiphase CT imaging of the brain was performed following IV bolus contrast injection. Subsequent parametric perfusion maps were calculated using RAPID software. CONTRAST:  OMNIPAQUE IOHEXOL 350 MG/ML SOLN COMPARISON:  None available. FINDINGS: CT HEAD FINDINGS Brain: Generalized age-related cerebral atrophy with moderate chronic microvascular ischemic disease. No acute intracranial hemorrhage. No acute large vessel territory infarct. No mass lesion or midline shift. No hydrocephalus or extra-axial fluid collection. Vascular: No hyperdense vessel. Scattered vascular calcifications noted within the carotid siphons. Skull: No visible scalp soft tissue abnormality.  Calvarium intact. Sinuses/Orbits: Globes and orbital soft tissues demonstrate no acute finding. Paranasal sinuses are largely clear. Sequelae of  chronic left maxillary sinusitis noted. No mastoid effusion. Other: None. ASPECTS (Alberta Stroke Program Early CT Score) - Ganglionic level infarction (caudate, lentiform nuclei, internal capsule, insula, M1-M3 cortex): 7 - Supraganglionic infarction (M4-M6 cortex): 3 Total score (0-10 with 10 being normal): 10 Review of the MIP images confirms the above findings CTA NECK FINDINGS Aortic arch: Visualized aortic arch  normal caliber with normal 3 vessel morphology. Mild atheromatous change about the arch itself. No hemodynamically significant stenosis about the origin the great vessels. Right carotid system: Right common and internal carotid arteries are diffusely tortuous but widely patent without stenosis, dissection or occlusion. Left carotid system: Left CCA tortuous but is widely patent to the bifurcation. Eccentric soft plaque at the proximal left ICA without significant stenosis. Left ICA mildly tortuous but widely patent distally without stenosis, dissection or occlusion. Vertebral arteries: Both vertebral arteries arise from the subclavian arteries. No proximal subclavian artery stenosis. Left vertebral artery dominant. Vertebral arteries are tortuous but widely patent to the skull base without stenosis, dissection or occlusion. Skeleton: No visible acute osseous abnormality. Mild chronic height loss noted at the T4 vertebral body. No discrete or worrisome osseous lesions. Moderate spondylosis present at C4-5 through C6-7. Torus mandibularis noted. Other neck: No other acute soft tissue abnormality within the neck. No mass or adenopathy. Upper chest: Visualized upper chest demonstrates no acute finding. Review of the MIP images confirms the above findings CTA HEAD FINDINGS Anterior circulation: Petrous segments widely patent. Mild atheromatous change within the carotid siphons without hemodynamically significant stenosis. ICAs are somewhat dolichoectatic through the carotid siphons. A1 segments patent bilaterally. Normal anterior communicating artery complex. Right ACA widely patent. Short-segment moderate left A2 stenosis noted (series 8, image 79). No M1 stenosis or occlusion. Normal MCA bifurcations. Distal MCA branches well perfused and symmetric. Patent Posterior circulation: Both vertebral arteries patent to the vertebrobasilar junction without stenosis. Left vertebral artery dominant. Both PICA origins patent and  normal. Basilar patent to its distal aspect without stenosis. Superior cerebellar arteries patent bilaterally. Right PCA supplied via the basilar. Multifocal moderate stenoses noted involving the mid and distal right P2 segment. Fetal type origin of the left PCA. Left PCA well perfused to its distal aspect without stenosis. Venous sinuses: Patent allowing for timing of the contrast bolus. Anatomic variants: Fetal type origin of the left PCA. Dominant left vertebral artery. No intracranial aneurysm. Review of the MIP images confirms the above findings CT Brain Perfusion Findings: ASPECTS: 10 CBF (<30%) Volume: 0mL Perfusion (Tmax>6.0s) volume: 3mL Mismatch Volume: 3mL Infarction Location:No evidence for acute ischemia by CT perfusion. Apparent 3 cc perfusion deficit at the posterior right parietal vertex favored to be artifactual. No definite perfusion abnormality. IMPRESSION: CT HEAD IMPRESSION: 1. No acute intracranial abnormality identified. 2. Aspects = 10. 3. Age-related cerebral atrophy with moderate chronic microvascular ischemic disease. CTA HEAD AND NECK IMPRESSION: 1. Negative CTA for emergent large vessel occlusion. 2. Intracranial atherosclerotic disease with associated moderate left A2 and right P2 stenoses. No other hemodynamically significant or correctable stenosis. 3. Diffuse tortuosity of the major arterial vasculature of the head and neck, suggesting chronic underlying hypertension. CT PERFUSION IMPRESSION: Negative CT perfusion. No evidence for acute core infarct or other perfusion abnormality. Critical Value/emergent results were called by telephone at the time of interpretation on 08/13/2020 at 11:25 pm and again at 11:40 pm to provider [facility], who verbally acknowledged these results. Electronically Signed   By: Rise Mu M.D.   On: 08/14/2020 00:17   CT ANGIO  HEAD CODE STROKE  Result Date: 08/14/2020 CLINICAL DATA:  Initial evaluation for acute aphasia. EXAM: CT  ANGIOGRAPHY HEAD AND NECK CT PERFUSION BRAIN TECHNIQUE: Multidetector CT imaging of the head and neck was performed using the standard protocol during bolus administration of intravenous contrast. Multiplanar CT image reconstructions and MIPs were obtained to evaluate the vascular anatomy. Carotid stenosis measurements (when applicable) are obtained utilizing NASCET criteria, using the distal internal carotid diameter as the denominator. Multiphase CT imaging of the brain was performed following IV bolus contrast injection. Subsequent parametric perfusion maps were calculated using RAPID software. CONTRAST:  OMNIPAQUE IOHEXOL 350 MG/ML SOLN COMPARISON:  None available. FINDINGS: CT HEAD FINDINGS Brain: Generalized age-related cerebral atrophy with moderate chronic microvascular ischemic disease. No acute intracranial hemorrhage. No acute large vessel territory infarct. No mass lesion or midline shift. No hydrocephalus or extra-axial fluid collection. Vascular: No hyperdense vessel. Scattered vascular calcifications noted within the carotid siphons. Skull: No visible scalp soft tissue abnormality.  Calvarium intact. Sinuses/Orbits: Globes and orbital soft tissues demonstrate no acute finding. Paranasal sinuses are largely clear. Sequelae of chronic left maxillary sinusitis noted. No mastoid effusion. Other: None. ASPECTS (Al[place] Stroke Program Early CT Score) - Ganglionic level infarction (caudate, lentiform nuclei, internal capsule, insula, M1-M3 cortex): 7 - Supraganglionic infarction (M4-M6 cortex): 3 Total score (0-10 with 10 being normal): 10 Review of the MIP images confirms the above findings CTA NECK FINDINGS Aortic arch: Visualized aortic arch normal caliber with normal 3 vessel morphology. Mild atheromatous change about the arch itself. No hemodynamically significant stenosis about the origin the great vessels. Right carotid system: Right common and internal carotid arteries are diffusely tortuous  but widely patent without stenosis, dissection or occlusion. Left carotid system: Left CCA tortuous but is widely patent to the bifurcation. Eccentric soft plaque at the proximal left ICA without significant stenosis. Left ICA mildly tortuous but widely patent distally without stenosis, dissection or occlusion. Vertebral arteries: Both vertebral arteries arise from the subclavian arteries. No proximal subclavian artery stenosis. Left vertebral artery dominant. Vertebral arteries are tortuous but widely patent to the skull base without stenosis, dissection or occlusion. Skeleton: No visible acute osseous abnormality. Mild chronic height loss noted at the T4 vertebral body. No discrete or worrisome osseous lesions. Moderate spondylosis present at C4-5 through C6-7. Torus mandibularis noted. Other neck: No other acute soft tissue abnormality within the neck. No mass or adenopathy. Upper chest: Visualized upper chest demonstrates no acute finding. Review of the MIP images confirms the above findings CTA HEAD FINDINGS Anterior circulation: Petrous segments widely patent. Mild atheromatous change within the carotid siphons without hemodynamically significant stenosis. ICAs are somewhat dolichoectatic through the carotid siphons. A1 segments patent bilaterally. Normal anterior communicating artery complex. Right ACA widely patent. Short-segment moderate left A2 stenosis noted (series 8, image 79). No M1 stenosis or occlusion. Normal MCA bifurcations. Distal MCA branches well perfused and symmetric. Patent Posterior circulation: Both vertebral arteries patent to the vertebrobasilar junction without stenosis. Left vertebral artery dominant. Both PICA origins patent and normal. Basilar patent to its distal aspect without stenosis. Superior cerebellar arteries patent bilaterally. Right PCA supplied via the basilar. Multifocal moderate stenoses noted involving the mid and distal right P2 segment. Fetal type origin of the left  PCA. Left PCA well perfused to its distal aspect without stenosis. Venous sinuses: Patent allowing for timing of the contrast bolus. Anatomic variants: Fetal type origin of the left PCA. Dominant left vertebral artery. No intracranial aneurysm. Review of the MIP images  confirms the above findings CT Brain Perfusion Findings: ASPECTS: 10 CBF (<30%) Volume: 0mL Perfusion (Tmax>6.0s) volume: 3mL Mismatch Volume: 3mL Infarction Location:No evidence for acute ischemia by CT perfusion. Apparent 3 cc perfusion deficit at the posterior right parietal vertex favored to be artifactual. No definite perfusion abnormality. IMPRESSION: CT HEAD IMPRESSION: 1. No acute intracranial abnormality identified. 2. Aspects = 10. 3. Age-related cerebral atrophy with moderate chronic microvascular ischemic disease. CTA HEAD AND NECK IMPRESSION: 1. Negative CTA for emergent large vessel occlusion. 2. Intracranial atherosclerotic disease with associated moderate left A2 and right P2 stenoses. No other hemodynamically significant or correctable stenosis. 3. Diffuse tortuosity of the major arterial vasculature of the head and neck, suggesting chronic underlying hypertension. CT PERFUSION IMPRESSION: Negative CT perfusion. No evidence for acute core infarct or other perfusion abnormality. Critical Value/emergent results were called by telephone at the time of interpretation on 08/13/2020 at 11:25 pm and again at 11:40 pm to [facility], who verbally acknowledged these results. Electronically Signed   By: Rise MuBenjamin  McClintock M.D.   On: 08/14/2020 00:17   CT ANGIO NECK CODE STROKE  Result Date: 08/14/2020 CLINICAL DATA:  Initial evaluation for acute aphasia. EXAM: CT ANGIOGRAPHY HEAD AND NECK CT PERFUSION BRAIN TECHNIQUE: Multidetector CT imaging of the head and neck was performed using the standard protocol during bolus administration of intravenous contrast. Multiplanar CT image reconstructions and MIPs were obtained to evaluate  the vascular anatomy. Carotid stenosis measurements (when applicable) are obtained utilizing NASCET criteria, using the distal internal carotid diameter as the denominator. Multiphase CT imaging of the brain was performed following IV bolus contrast injection. Subsequent parametric perfusion maps were calculated using RAPID software. CONTRAST:  100mL OMNIPAQUE IOHEXOL 350 MG/ML SOLN COMPARISON:  None available. FINDINGS: CT HEAD FINDINGS Brain: Generalized age-related cerebral atrophy with moderate chronic microvascular ischemic disease. No acute intracranial hemorrhage. No acute large vessel territory infarct. No mass lesion or midline shift. No hydrocephalus or extra-axial fluid collection. Vascular: No hyperdense vessel. Scattered vascular calcifications noted within the carotid siphons. Skull: No visible scalp soft tissue abnormality.  Calvarium intact. Sinuses/Orbits: Globes and orbital soft tissues demonstrate no acute finding. Paranasal sinuses are largely clear. Sequelae of chronic left maxillary sinusitis noted. No mastoid effusion. Other: None. ASPECTS (Alberta Stroke Program Early CT Score) - Ganglionic level infarction (caudate, lentiform nuclei, internal capsule, insula, M1-M3 cortex): 7 - Supraganglionic infarction (M4-M6 cortex): 3 Total score (0-10 with 10 being normal): 10 Review of the MIP images confirms the above findings CTA NECK FINDINGS Aortic arch: Visualized aortic arch normal caliber with normal 3 vessel morphology. Mild atheromatous change about the arch itself. No hemodynamically significant stenosis about the origin the great vessels. Right carotid system: Right common and internal carotid arteries are diffusely tortuous but widely patent without stenosis, dissection or occlusion. Left carotid system: Left CCA tortuous but is widely patent to the bifurcation. Eccentric soft plaque at the proximal left ICA without significant stenosis. Left ICA mildly tortuous but widely patent distally  without stenosis, dissection or occlusion. Vertebral arteries: Both vertebral arteries arise from the subclavian arteries. No proximal subclavian artery stenosis. Left vertebral artery dominant. Vertebral arteries are tortuous but widely patent to the skull base without stenosis, dissection or occlusion. Skeleton: No visible acute osseous abnormality. Mild chronic height loss noted at the T4 vertebral body. No discrete or worrisome osseous lesions. Moderate spondylosis present at C4-5 through C6-7. Torus mandibularis noted. Other neck: No other acute soft tissue abnormality within the neck. No mass or  adenopathy. Upper chest: Visualized upper chest demonstrates no acute finding. Review of the MIP images confirms the above findings CTA HEAD FINDINGS Anterior circulation: Petrous segments widely patent. Mild atheromatous change within the carotid siphons without hemodynamically significant stenosis. ICAs are somewhat dolichoectatic through the carotid siphons. A1 segments patent bilaterally. Normal anterior communicating artery complex. Right ACA widely patent. Short-segment moderate left A2 stenosis noted (series 8, image 79). No M1 stenosis or occlusion. Normal MCA bifurcations. Distal MCA branches well perfused and symmetric. Patent Posterior circulation: Both vertebral arteries patent to the vertebrobasilar junction without stenosis. Left vertebral artery dominant. Both PICA origins patent and normal. Basilar patent to its distal aspect without stenosis. Superior cerebellar arteries patent bilaterally. Right PCA supplied via the basilar. Multifocal moderate stenoses noted involving the mid and distal right P2 segment. Fetal type origin of the left PCA. Left PCA well perfused to its distal aspect without stenosis. Venous sinuses: Patent allowing for timing of the contrast bolus. Anatomic variants: Fetal type origin of the left PCA. Dominant left vertebral artery. No intracranial aneurysm. Review of the MIP images  confirms the above findings CT Brain Perfusion Findings: ASPECTS: 10 CBF (<30%) Volume: 68mL Perfusion (Tmax>6.0s) volume: 28mL Mismatch Volume: 62mL Infarction Location:No evidence for acute ischemia by CT perfusion. Apparent 3 cc perfusion deficit at the posterior right parietal vertex favored to be artifactual. No definite perfusion abnormality. IMPRESSION: CT HEAD IMPRESSION: 1. No acute intracranial abnormality identified. 2. Aspects = 10. 3. Age-related cerebral atrophy with moderate chronic microvascular ischemic disease. CTA HEAD AND NECK IMPRESSION: 1. Negative CTA for emergent large vessel occlusion. 2. Intracranial atherosclerotic disease with associated moderate left A2 and right P2 stenoses. No other hemodynamically significant or correctable stenosis. 3. Diffuse tortuosity of the major arterial vasculature of the head and neck, suggesting chronic underlying hypertension. CT PERFUSION IMPRESSION: Negative CT perfusion. No evidence for acute core infarct or other perfusion abnormality. Critical Value/emergent results were called by telephone at the time of interpretation on 08/13/2020 at 11:25 pm and again at 11:40 pm to provider [facility], who verbally acknowledged these results. Electronically Signed   By: Rise Mu M.D.   On: 08/14/2020 00:17    EKG: Independently reviewed.  Normal sinus rhythm.  Assessment/Plan Principal Problem:   Slurred speech    Bifrontal headache with nausea vomiting and periods of difficulty articulating/communicating -at this time patient's symptoms as per the neurologist are concerning for possible seizures for which patient was given 1 dose of Keppra EEG has been ordered along with MRI brain and MRV brain.  We will closely monitor.   DVT prophylaxis: SCDs for now until we make sure that patient may not need lumbar puncture. Code Status: Full code. Family Communication: Patient's daughter. Disposition Plan: Home. Consults called:  Neurologist. Admission status: Observation.   Eduard Clos MD [facility] Pager [phone].  If 7PM-7AM, please contact night-coverage [email or web address] Password [email or web address]  08/14/2020, 12:58 AM

## 2020-08-14 NOTE — Consult Note (Signed)
NEUROLOGY CONSULTATION NOTE   Date of service: August 14, 2020 Patient Name: Carla White MRN:  [id number] DOB:  [birth year] Reason for consult: "Stroke code" Requesting Provider: Nira Conn, * _ _ _   _ __   _ __ _ _  __ __   _ __   __ _  History of Present Illness  Carla White is a 85 y.o. female who is relatively healthy with no significant PMH who presents with episodes of aphasia, nausea and bifrontal headache. Reports that she was doing well, had a fall a few days ago when she tripped off a step and fell on her knee. This was on Thursday. She did not hit her head. Was doing fine when today she has been having headache and nausea and difficulty keeping anything down. She has also been feeling off today.  I spoke to daughter who reports that when she called her on phone at noon during her lunch time, she wasnt articulating words really well. Was abe to talk to daughter later after work so she did not think too much of it. Has had 3 people check on her, last time she was seen in evening and had trouble with words so eventually called EMS.  Patient reports some cough over the last few days but not too bad. Urine leaks from time to time. No rash. No diarrhea. Does have nausea and vomiting.  Her symptoms had resolved when I saw her initially but then had a short episode of aphasia on repeat evaluation after the CT scans.  MRS: 1 NIHSS: 0 TPA/Thrombectomy:  ROS   Constitutional Denies weight loss, fever and chills.  HEENT Denies changes in vision and hearing.  Respiratory Denies SOB, mild cough over the last few days.  CV Denies palpitations and CP   GI Denies abdominal pain, nausea, vomiting and diarrhea.   GU Denies dysuria and urinary frequency.   MSK Denies myalgia and joint pain.   Skin Denies rash and pruritus.   Neurological Denies headache and syncope.   Psychiatric Denies recent changes in mood. Denies anxiety and depression.    Past History  No past  medical history on file.  No family history on file. Social History   Socioeconomic History  . Marital status: Married    Spouse name: Not on file  . Number of children: Not on file  . Years of education: Not on file  . Highest education level: Not on file  Occupational History  . Not on file  Tobacco Use  . Smoking status: Not on file  . Smokeless tobacco: Not on file  Substance and Sexual Activity  . Alcohol use: Not on file  . Drug use: Not on file  . Sexual activity: Not on file  Other Topics Concern  . Not on file  Social History Narrative  . Not on file   Social Determinants of Health   Financial Resource Strain: Not on file  Food Insecurity: Not on file  Transportation Needs: Not on file  Physical Activity: Not on file  Stress: Not on file  Social Connections: Not on file   Not on File  Medications  (Not in a hospital admission)    Vitals   Vitals:   08/13/20 2331 08/13/20 2345  BP:  (!) 141/80  Pulse:  (!) 102  Resp:  19  Temp:  (!) 97.5 F (36.4 C)  TempSrc:  Oral  SpO2:  99%  Weight:  58.1 kg  Height: 5'  8" (1.727 m) 5\' 7"  (1.702 m)     Body mass index is 20.05 kg/m.  Physical Exam   General: Laying comfortably in bed; in no acute distress.  HENT: Normal oropharynx and mucosa. Normal external appearance of ears and nose.  Neck: Supple, no pain or tenderness  CV: No JVD. No peripheral edema.  Pulmonary: Symmetric Chest rise. Normal respiratory effort.  Abdomen: Soft to touch, non-tender.  Ext: No cyanosis, edema, or deformity  Skin: No rash. Normal palpation of skin.   Musculoskeletal: Normal digits and nails by inspection. No clubbing.   Neurologic Examination  Mental status/Cognition: Alert, oriented to self, place, month and year, good attention.  Speech/language: Fluent, comprehension intact, object naming intact, repetition intact.  Cranial nerves:   CN II Pupils equal and reactive to light, no VF deficits    CN III,IV,VI EOM  intact, no gaze preference or deviation, no nystagmus   CN V normal sensation in V1, V2, and V3 segments bilaterally    CN VII no asymmetry, no nasolabial fold flattening    CN VIII normal hearing to speech   CN IX & X normal palatal elevation, no uvular deviation   CN XI 5/5 head turn and 5/5 shoulder shrug bilaterally    CN XII midline tongue protrusion    Motor:  Muscle bulk: normal, tone normal, pronator drift none tremor none Mvmt Root Nerve  Muscle Right Left Comments  SA C5/6 Ax Deltoid 5 5   EF C5/6 Mc Biceps 5 5   EE C6/7/8 Rad Triceps 5 5   WF C6/7 Med FCR     WE C7/8 PIN ECU     F Ab C8/T1 U ADM/FDI 5 5   HF L1/2/3 Fem Illopsoas 5 5   KE L2/3/4 Fem Quad 5 5   DF L4/5 D Peron Tib Ant 5 5   PF S1/2 Tibial Grc/Sol 5 5    Reflexes:  Right Left Comments  Pectoralis      Biceps (C5/6) 2 2   Brachioradialis (C5/6) 2 2    Triceps (C6/7) 2 2    Patellar (L3/4) 2 2    Achilles (S1)      Hoffman      Plantar     Jaw jerk    Sensation:  Light touch intact   Pin prick    Temperature    Vibration   Proprioception    Coordination/Complex Motor:  - Finger to Nose intact BL - Heel to shin intact BL - Rapid alternating movement are normal - Gait: Deferred.  Labs   CBC:  Recent Labs  Lab 08/13/20 2306 08/13/20 2314  WBC 11.2*  --   NEUTROABS 8.7*  --   HGB 13.0 13.3  HCT 38.4 39.0  MCV 95.5  --   PLT 352  --     Basic Metabolic Panel:  Lab Results  Component Value Date   NA 135 08/13/2020   K 4.3 08/13/2020   CO2 25 08/13/2020   GLUCOSE 142 (H) 08/13/2020   BUN 17 08/13/2020   CREATININE 0.90 08/13/2020   CALCIUM 9.1 08/13/2020   GFRNONAA 53 (L) 08/13/2020   Lipid Panel: No results found for: LDLCALC HgbA1c: No results found for: HGBA1C Urine Drug Screen: No results found for: LABOPIA, COCAINSCRNUR, LABBENZ, AMPHETMU, THCU, LABBARB  Alcohol Level     Component Value Date/Time   ETH <10 08/13/2020 2306    CT Head without contrast: CTH was  negative for a large hypodensity concerning for  a large territory infarct or hyperdensity concerning for an ICH  CT angio Head and Neck with contrast: No LVO  MRI Brain: Pending  MR Venogram: Pending  rEEG:  pending  Impression   Carla White is a 85 y.o. female with PMH significant for who is relatively healthy with no significant PMH who presents with episodes of aphasia, nausea and bifrontal headache. She has had quite a few brief episodes of aphasia intermittently over the last several hours. These are short lived and she has no previous history of strokes or seizures.  I suspect that these may be epileptic aphasia, will load her up with Keppa and get a routine EEG along with MRI Brain. In addition, given significant nausea and headache, will add an MR Venogram.  CTH negative for a large stroke, CTA with no LVO, CTP with no mismatch.  Recommendations  - zofran 4mg  Iv once given in the CT scanner for nasuea. - Keppra 1500mg  IV once ordered - Seizure precautions - MRI Brain without contrast and MR Venogram Head. - routine EEG in AM. - Will need to hook up to [facility] EEG overnight if she has more episodes. ______________________________________________________________________   Thank you for the opportunity to take part in the care of this patient. If you have any further questions, please contact the neurology consultation attending.  Signed,  [facility] Pager Number _ _ _   _ __   _ __ _ _  __ __   _ __   __ _

## 2020-08-14 NOTE — Progress Notes (Signed)
PROGRESS NOTE    ASLAN MONTAGNA  NGE:[id number] DOB: September 05, 1935 DOA: 08/13/2020 PCP: Lupita Raider, MD    Chief Complaint  Patient presents with   Code Stroke    Brief Narrative Patient is a pleasant 85 year old female with no significant past medical history presenting with episodes of nausea, bifrontal headache, aphasia.  Head CT done, CT angiogram head and neck, CT perfusion study negative for any acute abnormalities.  Patient seen in consultation by neurology and due to concern for seizures patient loaded with IV Keppra and placed on IV Keppra.  MRI brain and MRV brain ordered.  EEG ordered.   Assessment & Plan:   Principal Problem:   Aphasia Active Problems:   Slurred speech   Headache   1 bifrontal headache with nausea and vomiting, aphasia -Questionable etiology.  Concern for possible seizures. -CT head, CT angio head and neck, CT perfusion study with no acute abnormalities. -MRI brain/MRV brain negative for any acute abnormalities. -Patient with no signs or symptoms of infection. -EEG pending. -Patient seen in consultation by neurology and due to concerns for seizures was loaded with IV Keppra and placed on IV Keppra twice daily. -Discontinue Zofran and placed on IV Compazine as needed. -Neurology following.  2.  Borderline blood pressure -IV fluids.    DVT prophylaxis: SCDs Code Status: Full Family Communication: Updated patient.  No family at bedside. Disposition:   Status is: Observation  The patient remains OBS appropriate and will d/c before 2 midnights.  Dispo: The patient is from: Home              Anticipated d/c is to: Home              Patient currently is not medically stable to d/c.   Difficult to place patient No       Consultants:  Neurology: Dr.Khaliqdina 08/15/2019  Procedures:  CT cerebral perfusion 08/13/2020 CT angiogram head and neck 08/13/2020 CT head 08/13/2020 MRI head 08/14/2020 MR venogram 08/14/2020   Antimicrobials:   None   Subjective: Laying in bed holding her forehead.  Still with headache however states some improvement from admission and rates the headache as a 5/10.  Denies any photophobia or phonophobia.  No chest pain.  No shortness of breath.  No abdominal pain.  No emesis.  Objective: Vitals:   08/14/20 0747 08/14/20 1114 08/14/20 1246 08/14/20 1652  BP: (!) 146/66 138/72 (!) 146/61 (!) 142/61  Pulse: 79 68 69 69  Resp: Temp: 98.7 F (37.1 C) 97.8 F (36.6 C) 97.7 F (36.5 C) 97.7 F (36.5 C)  TempSrc: Axillary Axillary Axillary Axillary  SpO2: 97% 97% 97% 99%  Weight:      Height:        Intake/Output Summary (Last 24 hours) at 08/14/2020 1955 Last data filed at 08/14/2020 1803 Gross per 24 hour  Intake 1142.44 ml  Output 1000 ml  Net 142.44 ml   Filed Weights   08/13/20 2345 08/14/20 0311  Weight: 58.1 kg 74.1 kg    Examination:  General exam: Appears calm and comfortable  Respiratory system: Clear to auscultation. Respiratory effort normal. Cardiovascular system: S1 & S2 heard, RRR. No JVD, murmurs, rubs, gallops or clicks. No pedal edema. Gastrointestinal system: Abdomen is nondistended, soft and nontender. No organomegaly or masses felt. Normal bowel sounds heard. Central nervous system: Alert and oriented. No focal neurological deficits. Extremities: Symmetric 5 x 5 power. Skin: No rashes, lesions or ulcers Psychiatry: Judgement and insight  appear normal. Mood & affect appropriate.     Data Reviewed: I have personally reviewed following labs and imaging studies  CBC: Recent Labs  Lab 08/13/20 2306 08/13/20 2314 08/14/20 0618  WBC 11.2*  --  9.2  NEUTROABS 8.7*  --   --   HGB 13.0 13.3 11.9*  HCT 38.4 39.0 34.3*  MCV 95.5  --  96.6  PLT 352  --  337    Basic Metabolic Panel: Recent Labs  Lab 08/13/20 2306 08/13/20 2314 08/13/20 2320 08/14/20 0618  NA 135 135  --  133*  K 4.3 4.3  --  4.1  CL 100 100  --  97*  CO2 25  --   --  27   GLUCOSE 146* 142*  --  107*  BUN 16 17  --  13  CREATININE 1.04* 0.90  --  1.03*  CALCIUM 9.1  --   --  9.0  MG  --   --  2.0  --     GFR: Estimated Creatinine Clearance: 43.5 mL/min (A) (by C-G formula based on SCr of 1.03 mg/dL (H)).  Liver Function Tests: Recent Labs  Lab 08/13/20 2306  AST 28  ALT 21  ALKPHOS 102  BILITOT 0.8  PROT 7.0  ALBUMIN 3.9    CBG: Recent Labs  Lab 08/13/20 2307  GLUCAP 138*     Recent Results (from the past 240 hour(s))  Resp Panel by RT-PCR (Flu A&B, Covid) Nasopharyngeal Swab     Status: None   Collection Time: 08/13/20 11:06 PM   Specimen: Nasopharyngeal Swab; Nasopharyngeal(NP) swabs in vial transport medium  Result Value Ref Range Status   SARS Coronavirus 2 by RT PCR NEGATIVE NEGATIVE Final    Comment: (NOTE) SARS-CoV-2 target nucleic acids are NOT DETECTED.  The SARS-CoV-2 RNA is generally detectable in upper respiratory specimens during the acute phase of infection. The lowest concentration of SARS-CoV-2 viral copies this assay can detect is 138 copies/mL. A negative result does not preclude SARS-Cov-2 infection and should not be used as the sole basis for treatment or other patient management decisions. A negative result may occur with  improper specimen collection/handling, submission of specimen other than nasopharyngeal swab, presence of viral mutation(s) within the areas targeted by this assay, and inadequate number of viral copies(<138 copies/mL). A negative result must be combined with clinical observations, patient history, and epidemiological information. The expected result is Negative.  Fact Sheet for Patients:  BloggerCourse.com  Fact Sheet for Healthcare Providers:  SeriousBroker.it  This test is no t yet approved or cleared by the [place] FDA and  has been authorized for detection and/or diagnosis of SARS-CoV-2 by FDA under an Emergency Use  Authorization (EUA). This EUA will remain  in effect (meaning this test can be used) for the duration of the COVID-19 declaration under Section 564(b)(1) of the Act, 21 U.S.C.section 360bbb-3(b)(1), unless the authorization is terminated  or revoked sooner.       Influenza A by PCR NEGATIVE NEGATIVE Final   Influenza B by PCR NEGATIVE NEGATIVE Final    Comment: (NOTE) The Xpert Xpress SARS-CoV-2/FLU/RSV plus assay is intended as an aid in the diagnosis of influenza from Nasopharyngeal swab specimens and should not be used as a sole basis for treatment. Nasal washings and aspirates are unacceptable for Xpert Xpress SARS-CoV-2/FLU/RSV testing.  Fact Sheet for Patients: BloggerCourse.com  Fact Sheet for Healthcare Providers: SeriousBroker.it  This test is not yet approved or cleared by the [place] FDA  and has been authorized for detection and/or diagnosis of SARS-CoV-2 by FDA under an Emergency Use Authorization (EUA). This EUA will remain in effect (meaning this test can be used) for the duration of the COVID-19 declaration under Section 564(b)(1) of the Act, 21 U.S.C. section 360bbb-3(b)(1), unless the authorization is terminated or revoked.  Performed at [facility], [address], [place], [place] [address]          Radiology Studies: MR BRAIN WO CONTRAST  Result Date: 08/14/2020 CLINICAL DATA:  Acute stroke suspected EXAM: MRI HEAD WITHOUT CONTRAST TECHNIQUE: Multiplanar, multiecho pulse sequences of the brain and surrounding structures were obtained without intravenous contrast. COMPARISON:  Head CT and CTA from yesterday FINDINGS: Brain: No acute infarction, hemorrhage, hydrocephalus, extra-axial collection or mass lesion. Chronic small vessel ischemia in the cerebral white matter that is moderate to extensive. Age normal brain volume. Small remote right cerebellar infarct. Vascular: Preserved flow voids.   There was preceding CTA. Skull and upper cervical spine: Normal marrow signal Sinuses/Orbits: Bilateral cataract resection. Other: Progressively motion degraded. IMPRESSION: 1. Motion degraded brain MRI without acute finding. 2. Extensive chronic small vessel ischemia. Electronically Signed   By: Marnee Spring M.D.   On: 08/14/2020 05:43   MR Venogram Head  Result Date: 08/14/2020 CLINICAL DATA:  Dural sinus thrombosis suspected. EXAM: MR VENOGRAM HEAD WITHOUT CONTRAST TECHNIQUE: Angiographic images of the intracranial venous structures were acquired using MRV technique without intravenous contrast. COMPARISON:  Brain MRI performed at the same time FINDINGS: Patent dural sinuses and symmetric deep veins. No evidence of cortical vein thrombosis. IMPRESSION: Negative for dural sinus thrombosis. Electronically Signed   By: Marnee Spring M.D.   On: 08/14/2020 05:45   CT CEREBRAL PERFUSION W CONTRAST  Result Date: 08/14/2020 CLINICAL DATA:  Initial evaluation for acute aphasia. EXAM: CT ANGIOGRAPHY HEAD AND NECK CT PERFUSION BRAIN TECHNIQUE: Multidetector CT imaging of the head and neck was performed using the standard protocol during bolus administration of intravenous contrast. Multiplanar CT image reconstructions and MIPs were obtained to evaluate the vascular anatomy. Carotid stenosis measurements (when applicable) are obtained utilizing NASCET criteria, using the distal internal carotid diameter as the denominator. Multiphase CT imaging of the brain was performed following IV bolus contrast injection. Subsequent parametric perfusion maps were calculated using RAPID software. CONTRAST:  OMNIPAQUE IOHEXOL 350 MG/ML SOLN COMPARISON:  None available. FINDINGS: CT HEAD FINDINGS Brain: Generalized age-related cerebral atrophy with moderate chronic microvascular ischemic disease. No acute intracranial hemorrhage. No acute large vessel territory infarct. No mass lesion or midline shift. No hydrocephalus or  extra-axial fluid collection. Vascular: No hyperdense vessel. Scattered vascular calcifications noted within the carotid siphons. Skull: No visible scalp soft tissue abnormality.  Calvarium intact. Sinuses/Orbits: Globes and orbital soft tissues demonstrate no acute finding. Paranasal sinuses are largely clear. Sequelae of chronic left maxillary sinusitis noted. No mastoid effusion. Other: None. ASPECTS (Alberta Stroke Program Early CT Score) - Ganglionic level infarction (caudate, lentiform nuclei, internal capsule, insula, M1-M3 cortex): 7 - Supraganglionic infarction (M4-M6 cortex): 3 Total score (0-10 with 10 being normal): 10 Review of the MIP images confirms the above findings CTA NECK FINDINGS Aortic arch: Visualized aortic arch normal caliber with normal 3 vessel morphology. Mild atheromatous change about the arch itself. No hemodynamically significant stenosis about the origin the great vessels. Right carotid system: Right common and internal carotid arteries are diffusely tortuous but widely patent without stenosis, dissection or occlusion. Left carotid system: Left CCA tortuous but is widely patent  to the bifurcation. Eccentric soft plaque at the proximal left ICA without significant stenosis. Left ICA mildly tortuous but widely patent distally without stenosis, dissection or occlusion. Vertebral arteries: Both vertebral arteries arise from the subclavian arteries. No proximal subclavian artery stenosis. Left vertebral artery dominant. Vertebral arteries are tortuous but widely patent to the skull base without stenosis, dissection or occlusion. Skeleton: No visible acute osseous abnormality. Mild chronic height loss noted at the T4 vertebral body. No discrete or worrisome osseous lesions. Moderate spondylosis present at C4-5 through C6-7. Torus mandibularis noted. Other neck: No other acute soft tissue abnormality within the neck. No mass or adenopathy. Upper chest: Visualized upper chest demonstrates no  acute finding. Review of the MIP images confirms the above findings CTA HEAD FINDINGS Anterior circulation: Petrous segments widely patent. Mild atheromatous change within the carotid siphons without hemodynamically significant stenosis. ICAs are somewhat dolichoectatic through the carotid siphons. A1 segments patent bilaterally. Normal anterior communicating artery complex. Right ACA widely patent. Short-segment moderate left A2 stenosis noted (series 8, image 79). No M1 stenosis or occlusion. Normal MCA bifurcations. Distal MCA branches well perfused and symmetric. Patent Posterior circulation: Both vertebral arteries patent to the vertebrobasilar junction without stenosis. Left vertebral artery dominant. Both PICA origins patent and normal. Basilar patent to its distal aspect without stenosis. Superior cerebellar arteries patent bilaterally. Right PCA supplied via the basilar. Multifocal moderate stenoses noted involving the mid and distal right P2 segment. Fetal type origin of the left PCA. Left PCA well perfused to its distal aspect without stenosis. Venous sinuses: Patent allowing for timing of the contrast bolus. Anatomic variants: Fetal type origin of the left PCA. Dominant left vertebral artery. No intracranial aneurysm. Review of the MIP images confirms the above findings CT Brain Perfusion Findings: ASPECTS: 10 CBF (<30%) Volume: 0mL Perfusion (Tmax>6.0s) volume: 3mL Mismatch Volume: 3mL Infarction Location:No evidence for acute ischemia by CT perfusion. Apparent 3 cc perfusion deficit at the posterior right parietal vertex favored to be artifactual. No definite perfusion abnormality. IMPRESSION: CT HEAD IMPRESSION: 1. No acute intracranial abnormality identified. 2. Aspects = 10. 3. Age-related cerebral atrophy with moderate chronic microvascular ischemic disease. CTA HEAD AND NECK IMPRESSION: 1. Negative CTA for emergent large vessel occlusion. 2. Intracranial atherosclerotic disease with associated  moderate left A2 and right P2 stenoses. No other hemodynamically significant or correctable stenosis. 3. Diffuse tortuosity of the major arterial vasculature of the head and neck, suggesting chronic underlying hypertension. CT PERFUSION IMPRESSION: Negative CT perfusion. No evidence for acute core infarct or other perfusion abnormality. Critical Value/emergent results were called by telephone at the time of interpretation on 08/13/2020 at 11:25 pm and again at 11:40 pm to provider [facility], who verbally acknowledged these results. Electronically Signed   By: Rise Mu M.D.   On: 08/14/2020 00:17   CT HEAD CODE STROKE WO CONTRAST  Result Date: 08/14/2020 CLINICAL DATA:  Initial evaluation for acute aphasia. EXAM: CT ANGIOGRAPHY HEAD AND NECK CT PERFUSION BRAIN TECHNIQUE: Multidetector CT imaging of the head and neck was performed using the standard protocol during bolus administration of intravenous contrast. Multiplanar CT image reconstructions and MIPs were obtained to evaluate the vascular anatomy. Carotid stenosis measurements (when applicable) are obtained utilizing NASCET criteria, using the distal internal carotid diameter as the denominator. Multiphase CT imaging of the brain was performed following IV bolus contrast injection. Subsequent parametric perfusion maps were calculated using RAPID software. CONTRAST:  OMNIPAQUE IOHEXOL 350 MG/ML SOLN COMPARISON:  None available. FINDINGS: CT HEAD  FINDINGS Brain: Generalized age-related cerebral atrophy with moderate chronic microvascular ischemic disease. No acute intracranial hemorrhage. No acute large vessel territory infarct. No mass lesion or midline shift. No hydrocephalus or extra-axial fluid collection. Vascular: No hyperdense vessel. Scattered vascular calcifications noted within the carotid siphons. Skull: No visible scalp soft tissue abnormality.  Calvarium intact. Sinuses/Orbits: Globes and orbital soft tissues demonstrate no  acute finding. Paranasal sinuses are largely clear. Sequelae of chronic left maxillary sinusitis noted. No mastoid effusion. Other: None. ASPECTS (Alber[place] Stroke Program Early CT Score) - Ganglionic level infarction (caudate, lentiform nuclei, internal capsule, insula, M1-M3 cortex): 7 - Supraganglionic infarction (M4-M6 cortex): 3 Total score (0-10 with 10 being normal): 10 Review of the MIP images confirms the above findings CTA NECK FINDINGS Aortic arch: Visualized aortic arch normal caliber with normal 3 vessel morphology. Mild atheromatous change about the arch itself. No hemodynamically significant stenosis about the origin the great vessels. Right carotid system: Right common and internal carotid arteries are diffusely tortuous but widely patent without stenosis, dissection or occlusion. Left carotid system: Left CCA tortuous but is widely patent to the bifurcation. Eccentric soft plaque at the proximal left ICA without significant stenosis. Left ICA mildly tortuous but widely patent distally without stenosis, dissection or occlusion. Vertebral arteries: Both vertebral arteries arise from the subclavian arteries. No proximal subclavian artery stenosis. Left vertebral artery dominant. Vertebral arteries are tortuous but widely patent to the skull base without stenosis, dissection or occlusion. Skeleton: No visible acute osseous abnormality. Mild chronic height loss noted at the T4 vertebral body. No discrete or worrisome osseous lesions. Moderate spondylosis present at C4-5 through C6-7. Torus mandibularis noted. Other neck: No other acute soft tissue abnormality within the neck. No mass or adenopathy. Upper chest: Visualized upper chest demonstrates no acute finding. Review of the MIP images confirms the above findings CTA HEAD FINDINGS Anterior circulation: Petrous segments widely patent. Mild atheromatous change within the carotid siphons without hemodynamically significant stenosis. ICAs are somewhat  dolichoectatic through the carotid siphons. A1 segments patent bilaterally. Normal anterior communicating artery complex. Right ACA widely patent. Short-segment moderate left A2 stenosis noted (series 8, image 79). No M1 stenosis or occlusion. Normal MCA bifurcations. Distal MCA branches well perfused and symmetric. Patent Posterior circulation: Both vertebral arteries patent to the vertebrobasilar junction without stenosis. Left vertebral artery dominant. Both PICA origins patent and normal. Basilar patent to its distal aspect without stenosis. Superior cerebellar arteries patent bilaterally. Right PCA supplied via the basilar. Multifocal moderate stenoses noted involving the mid and distal right P2 segment. Fetal type origin of the left PCA. Left PCA well perfused to its distal aspect without stenosis. Venous sinuses: Patent allowing for timing of the contrast bolus. Anatomic variants: Fetal type origin of the left PCA. Dominant left vertebral artery. No intracranial aneurysm. Review of the MIP images confirms the above findings CT Brain Perfusion Findings: ASPECTS: 10 CBF (<30%) Volume: 0mL Perfusion (Tmax>6.0s) volume: 3mL Mismatch Volume: 3mL Infarction Location:No evidence for acute ischemia by CT perfusion. Apparent 3 cc perfusion deficit at the posterior right parietal vertex favored to be artifactual. No definite perfusion abnormality. IMPRESSION: CT HEAD IMPRESSION: 1. No acute intracranial abnormality identified. 2. Aspects = 10. 3. Age-related cerebral atrophy with moderate chronic microvascular ischemic disease. CTA HEAD AND NECK IMPRESSION: 1. Negative CTA for emergent large vessel occlusion. 2. Intracranial atherosclerotic disease with associated moderate left A2 and right P2 stenoses. No other hemodynamically significant or correctable stenosis. 3. Diffuse tortuosity of the major arterial vasculature of  the head and neck, suggesting chronic underlying hypertension. CT PERFUSION IMPRESSION: Negative  CT perfusion. No evidence for acute core infarct or other perfusion abnormality. Critical Value/emergent results were called by telephone at the time of interpretation on 08/13/2020 at 11:25 pm and again at 11:40 pm to provider [facility], who verbally acknowledged these results. Electronically Signed   By: Rise Mu M.D.   On: 08/14/2020 00:17   CT ANGIO HEAD CODE STROKE  Result Date: 08/14/2020 CLINICAL DATA:  Initial evaluation for acute aphasia. EXAM: CT ANGIOGRAPHY HEAD AND NECK CT PERFUSION BRAIN TECHNIQUE: Multidetector CT imaging of the head and neck was performed using the standard protocol during bolus administration of intravenous contrast. Multiplanar CT image reconstructions and MIPs were obtained to evaluate the vascular anatomy. Carotid stenosis measurements (when applicable) are obtained utilizing NASCET criteria, using the distal internal carotid diameter as the denominator. Multiphase CT imaging of the brain was performed following IV bolus contrast injection. Subsequent parametric perfusion maps were calculated using RAPID software. CONTRAST:  OMNIPAQUE IOHEXOL 350 MG/ML SOLN COMPARISON:  None available. FINDINGS: CT HEAD FINDINGS Brain: Generalized age-related cerebral atrophy with moderate chronic microvascular ischemic disease. No acute intracranial hemorrhage. No acute large vessel territory infarct. No mass lesion or midline shift. No hydrocephalus or extra-axial fluid collection. Vascular: No hyperdense vessel. Scattered vascular calcifications noted within the carotid siphons. Skull: No visible scalp soft tissue abnormality.  Calvarium intact. Sinuses/Orbits: Globes and orbital soft tissues demonstrate no acute finding. Paranasal sinuses are largely clear. Sequelae of chronic left maxillary sinusitis noted. No mastoid effusion. Other: None. ASPECTS (Al[place] Stroke Program Early CT Score) - Ganglionic level infarction (caudate, lentiform nuclei, internal capsule,  insula, M1-M3 cortex): 7 - Supraganglionic infarction (M4-M6 cortex): 3 Total score (0-10 with 10 being normal): 10 Review of the MIP images confirms the above findings CTA NECK FINDINGS Aortic arch: Visualized aortic arch normal caliber with normal 3 vessel morphology. Mild atheromatous change about the arch itself. No hemodynamically significant stenosis about the origin the great vessels. Right carotid system: Right common and internal carotid arteries are diffusely tortuous but widely patent without stenosis, dissection or occlusion. Left carotid system: Left CCA tortuous but is widely patent to the bifurcation. Eccentric soft plaque at the proximal left ICA without significant stenosis. Left ICA mildly tortuous but widely patent distally without stenosis, dissection or occlusion. Vertebral arteries: Both vertebral arteries arise from the subclavian arteries. No proximal subclavian artery stenosis. Left vertebral artery dominant. Vertebral arteries are tortuous but widely patent to the skull base without stenosis, dissection or occlusion. Skeleton: No visible acute osseous abnormality. Mild chronic height loss noted at the T4 vertebral body. No discrete or worrisome osseous lesions. Moderate spondylosis present at C4-5 through C6-7. Torus mandibularis noted. Other neck: No other acute soft tissue abnormality within the neck. No mass or adenopathy. Upper chest: Visualized upper chest demonstrates no acute finding. Review of the MIP images confirms the above findings CTA HEAD FINDINGS Anterior circulation: Petrous segments widely patent. Mild atheromatous change within the carotid siphons without hemodynamically significant stenosis. ICAs are somewhat dolichoectatic through the carotid siphons. A1 segments patent bilaterally. Normal anterior communicating artery complex. Right ACA widely patent. Short-segment moderate left A2 stenosis noted (series 8, image 79). No M1 stenosis or occlusion. Normal MCA  bifurcations. Distal MCA branches well perfused and symmetric. Patent Posterior circulation: Both vertebral arteries patent to the vertebrobasilar junction without stenosis. Left vertebral artery dominant. Both PICA origins patent and normal. Basilar patent to its distal aspect  without stenosis. Superior cerebellar arteries patent bilaterally. Right PCA supplied via the basilar. Multifocal moderate stenoses noted involving the mid and distal right P2 segment. Fetal type origin of the left PCA. Left PCA well perfused to its distal aspect without stenosis. Venous sinuses: Patent allowing for timing of the contrast bolus. Anatomic variants: Fetal type origin of the left PCA. Dominant left vertebral artery. No intracranial aneurysm. Review of the MIP images confirms the above findings CT Brain Perfusion Findings: ASPECTS: 10 CBF (<30%) Volume: 0mL Perfusion (Tmax>6.0s) volume: 3mL Mismatch Volume: 3mL Infarction Location:No evidence for acute ischemia by CT perfusion. Apparent 3 cc perfusion deficit at the posterior right parietal vertex favored to be artifactual. No definite perfusion abnormality. IMPRESSION: CT HEAD IMPRESSION: 1. No acute intracranial abnormality identified. 2. Aspects = 10. 3. Age-related cerebral atrophy with moderate chronic microvascular ischemic disease. CTA HEAD AND NECK IMPRESSION: 1. Negative CTA for emergent large vessel occlusion. 2. Intracranial atherosclerotic disease with associated moderate left A2 and right P2 stenoses. No other hemodynamically significant or correctable stenosis. 3. Diffuse tortuosity of the major arterial vasculature of the head and neck, suggesting chronic underlying hypertension. CT PERFUSION IMPRESSION: Negative CT perfusion. No evidence for acute core infarct or other perfusion abnormality. Critical Value/emergent results were called by telephone at the time of interpretation on 08/13/2020 at 11:25 pm and again at 11:40 pm to provider [facility], who  verbally acknowledged these results. Electronically Signed   By: Rise Mu M.D.   On: 08/14/2020 00:17   CT ANGIO NECK CODE STROKE  Result Date: 08/14/2020 CLINICAL DATA:  Initial evaluation for acute aphasia. EXAM: CT ANGIOGRAPHY HEAD AND NECK CT PERFUSION BRAIN TECHNIQUE: Multidetector CT imaging of the head and neck was performed using the standard protocol during bolus administration of intravenous contrast. Multiplanar CT image reconstructions and MIPs were obtained to evaluate the vascular anatomy. Carotid stenosis measurements (when applicable) are obtained utilizing NASCET criteria, using the distal internal carotid diameter as the denominator. Multiphase CT imaging of the brain was performed following IV bolus contrast injection. Subsequent parametric perfusion maps were calculated using RAPID software. CONTRAST:  OMNIPAQUE IOHEXOL 350 MG/ML SOLN COMPARISON:  None available. FINDINGS: CT HEAD FINDINGS Brain: Generalized age-related cerebral atrophy with moderate chronic microvascular ischemic disease. No acute intracranial hemorrhage. No acute large vessel territory infarct. No mass lesion or midline shift. No hydrocephalus or extra-axial fluid collection. Vascular: No hyperdense vessel. Scattered vascular calcifications noted within the carotid siphons. Skull: No visible scalp soft tissue abnormality.  Calvarium intact. Sinuses/Orbits: Globes and orbital soft tissues demonstrate no acute finding. Paranasal sinuses are largely clear. Sequelae of chronic left maxillary sinusitis noted. No mastoid effusion. Other: None. ASPECTS ([place]berta Stroke Program Early CT Score) - Ganglionic level infarction (caudate, lentiform nuclei, internal capsule, insula, M1-M3 cortex): 7 - Supraganglionic infarction (M4-M6 cortex): 3 Total score (0-10 with 10 being normal): 10 Review of the MIP images confirms the above findings CTA NECK FINDINGS Aortic arch: Visualized aortic arch normal caliber with normal 3  vessel morphology. Mild atheromatous change about the arch itself. No hemodynamically significant stenosis about the origin the great vessels. Right carotid system: Right common and internal carotid arteries are diffusely tortuous but widely patent without stenosis, dissection or occlusion. Left carotid system: Left CCA tortuous but is widely patent to the bifurcation. Eccentric soft plaque at the proximal left ICA without significant stenosis. Left ICA mildly tortuous but widely patent distally without stenosis, dissection or occlusion. Vertebral arteries: Both vertebral arteries arise from the  subclavian arteries. No proximal subclavian artery stenosis. Left vertebral artery dominant. Vertebral arteries are tortuous but widely patent to the skull base without stenosis, dissection or occlusion. Skeleton: No visible acute osseous abnormality. Mild chronic height loss noted at the T4 vertebral body. No discrete or worrisome osseous lesions. Moderate spondylosis present at C4-5 through C6-7. Torus mandibularis noted. Other neck: No other acute soft tissue abnormality within the neck. No mass or adenopathy. Upper chest: Visualized upper chest demonstrates no acute finding. Review of the MIP images confirms the above findings CTA HEAD FINDINGS Anterior circulation: Petrous segments widely patent. Mild atheromatous change within the carotid siphons without hemodynamically significant stenosis. ICAs are somewhat dolichoectatic through the carotid siphons. A1 segments patent bilaterally. Normal anterior communicating artery complex. Right ACA widely patent. Short-segment moderate left A2 stenosis noted (series 8, image 79). No M1 stenosis or occlusion. Normal MCA bifurcations. Distal MCA branches well perfused and symmetric. Patent Posterior circulation: Both vertebral arteries patent to the vertebrobasilar junction without stenosis. Left vertebral artery dominant. Both PICA origins patent and normal. Basilar patent to its  distal aspect without stenosis. Superior cerebellar arteries patent bilaterally. Right PCA supplied via the basilar. Multifocal moderate stenoses noted involving the mid and distal right P2 segment. Fetal type origin of the left PCA. Left PCA well perfused to its distal aspect without stenosis. Venous sinuses: Patent allowing for timing of the contrast bolus. Anatomic variants: Fetal type origin of the left PCA. Dominant left vertebral artery. No intracranial aneurysm. Review of the MIP images confirms the above findings CT Brain Perfusion Findings: ASPECTS: 10 CBF (<30%) Volume: 0mL Perfusion (Tmax>6.0s) volume: 3mL Mismatch Volume: 3mL Infarction Location:No evidence for acute ischemia by CT perfusion. Apparent 3 cc perfusion deficit at the posterior right parietal vertex favored to be artifactual. No definite perfusion abnormality. IMPRESSION: CT HEAD IMPRESSION: 1. No acute intracranial abnormality identified. 2. Aspects = 10. 3. Age-related cerebral atrophy with moderate chronic microvascular ischemic disease. CTA HEAD AND NECK IMPRESSION: 1. Negative CTA for emergent large vessel occlusion. 2. Intracranial atherosclerotic disease with associated moderate left A2 and right P2 stenoses. No other hemodynamically significant or correctable stenosis. 3. Diffuse tortuosity of the major arterial vasculature of the head and neck, suggesting chronic underlying hypertension. CT PERFUSION IMPRESSION: Negative CT perfusion. No evidence for acute core infarct or other perfusion abnormality. Critical Value/emergent results were called by telephone at the time of interpretation on 08/13/2020 at 11:25 pm and again at 11:40 pm to provider [facility], who verbally acknowledged these results. Electronically Signed   By: Rise Mu M.D.   On: 08/14/2020 00:17        Scheduled Meds: Continuous Infusions:  sodium chloride 100 mL/hr at 08/14/20 1050   levETIRAcetam 500 mg (08/14/20 1732)     LOS: 0 days     Time spent: 35 minutes  No charge.    Ramiro Harvest, MD [facility]   To contact the attending provider between 7A-7P or the covering provider during after hours 7P-7A, please log into the web site [email or web address] and access using universal [place] password for that web site. If you do not have the password, please call the hospital operator.  08/14/2020, 7:55 PM

## 2020-08-15 ENCOUNTER — Observation Stay (HOSPITAL_COMMUNITY): Payer: PPO

## 2020-08-15 DIAGNOSIS — R4701 Aphasia: Secondary | ICD-10-CM | POA: Diagnosis present

## 2020-08-15 DIAGNOSIS — Z882 Allergy status to sulfonamides status: Secondary | ICD-10-CM | POA: Diagnosis not present

## 2020-08-15 DIAGNOSIS — R29700 NIHSS score 0: Secondary | ICD-10-CM | POA: Diagnosis present

## 2020-08-15 DIAGNOSIS — Z79899 Other long term (current) drug therapy: Secondary | ICD-10-CM | POA: Diagnosis not present

## 2020-08-15 DIAGNOSIS — G3184 Mild cognitive impairment, so stated: Secondary | ICD-10-CM | POA: Diagnosis not present

## 2020-08-15 DIAGNOSIS — R110 Nausea: Secondary | ICD-10-CM | POA: Diagnosis not present

## 2020-08-15 DIAGNOSIS — R4689 Other symptoms and signs involving appearance and behavior: Secondary | ICD-10-CM | POA: Diagnosis not present

## 2020-08-15 DIAGNOSIS — Z20822 Contact with and (suspected) exposure to covid-19: Secondary | ICD-10-CM | POA: Diagnosis present

## 2020-08-15 DIAGNOSIS — Z880 Allergy status to penicillin: Secondary | ICD-10-CM | POA: Diagnosis not present

## 2020-08-15 DIAGNOSIS — I639 Cerebral infarction, unspecified: Secondary | ICD-10-CM | POA: Diagnosis present

## 2020-08-15 DIAGNOSIS — Z885 Allergy status to narcotic agent status: Secondary | ICD-10-CM | POA: Diagnosis not present

## 2020-08-15 DIAGNOSIS — R000 Tachycardia, unspecified: Secondary | ICD-10-CM | POA: Diagnosis present

## 2020-08-15 DIAGNOSIS — R4189 Other symptoms and signs involving cognitive functions and awareness: Secondary | ICD-10-CM | POA: Diagnosis present

## 2020-08-15 DIAGNOSIS — R4781 Slurred speech: Secondary | ICD-10-CM | POA: Diagnosis present

## 2020-08-15 DIAGNOSIS — G9340 Encephalopathy, unspecified: Secondary | ICD-10-CM | POA: Diagnosis present

## 2020-08-15 DIAGNOSIS — R9401 Abnormal electroencephalogram [EEG]: Secondary | ICD-10-CM | POA: Diagnosis not present

## 2020-08-15 DIAGNOSIS — I6522 Occlusion and stenosis of left carotid artery: Secondary | ICD-10-CM | POA: Diagnosis present

## 2020-08-15 DIAGNOSIS — R112 Nausea with vomiting, unspecified: Secondary | ICD-10-CM | POA: Diagnosis present

## 2020-08-15 DIAGNOSIS — Z888 Allergy status to other drugs, medicaments and biological substances status: Secondary | ICD-10-CM | POA: Diagnosis not present

## 2020-08-15 DIAGNOSIS — R519 Headache, unspecified: Secondary | ICD-10-CM | POA: Diagnosis not present

## 2020-08-15 LAB — CBC
HCT: 35.8 % — ABNORMAL LOW (ref 36.0–46.0)
Hemoglobin: 12.1 g/dL (ref 12.0–15.0)
MCH: 32.8 pg (ref 26.0–34.0)
MCHC: 33.8 g/dL (ref 30.0–36.0)
MCV: 97 fL (ref 80.0–100.0)
Platelets: 305 10*3/uL (ref 150–400)
RBC: 3.69 MIL/uL — ABNORMAL LOW (ref 3.87–5.11)
RDW: 13.2 % (ref 11.5–15.5)
WBC: 8.3 10*3/uL (ref 4.0–10.5)
nRBC: 0 % (ref 0.0–0.2)

## 2020-08-15 LAB — BASIC METABOLIC PANEL
Anion gap: 6 (ref 5–15)
BUN: 10 mg/dL (ref 8–23)
CO2: 26 mmol/L (ref 22–32)
Calcium: 8.8 mg/dL — ABNORMAL LOW (ref 8.9–10.3)
Chloride: 104 mmol/L (ref 98–111)
Creatinine, Ser: 1.04 mg/dL — ABNORMAL HIGH (ref 0.44–1.00)
GFR, Estimated: 53 mL/min — ABNORMAL LOW (ref >60)
Glucose, Bld: 94 mg/dL (ref 70–99)
Potassium: 4.3 mmol/L (ref 3.5–5.1)
Sodium: 136 mmol/L (ref 135–145)

## 2020-08-15 LAB — MAGNESIUM: Magnesium: 2.2 mg/dL (ref 1.7–2.4)

## 2020-08-15 NOTE — Evaluation (Signed)
Physical Therapy Evaluation Patient Details Name: Carla White MRN: [id number] DOB: 12-24-1935 Today's Date: 08/15/2020   History of Present Illness  85 y.o. female presents to [facility] ED on 08/13/2020 with aphasia and bifrontal headache. CT and MRI negative. EEG suggestive of nonspecific cortical dysfunction in left frontotemporal region. Additionally there is evidence of mild diffuse encephalopathy, no seizures noted. No significant PMH noted.  Clinical Impression  Pt presents to PT with deficits in gait, balance, mobility, and cognition. Pt experiences multiple losses of balance when ambulating requiring physical assistance to correct for. Pt demonstrates limited awareness of her current deficits, and continues to demonstrate deficits in expressive communication throughout session. Pt has trouble following multi-step commands at times, and is unable to find her room when given the room number and instructed to look for room when ambulating. Pt will benefit from aggressive mobilization to aide in improving balance and reducing falls risk. PT recommends inpatient rehab placement at this time as the pt is independent, working, and active at baseline, and demonstrates the potential to return to independent mobility with aggressive therapies.    Follow Up Recommendations CIR    Equipment Recommendations  3in1 (PT)    Recommendations for Other Services Rehab consult     Precautions / Restrictions Precautions Precautions: Fall Restrictions Weight Bearing Restrictions: No      Mobility  Bed Mobility Overal bed mobility: Independent                  Transfers Overall transfer level: Needs assistance Equipment used: None Transfers: Sit to/from Stand Sit to Stand: Supervision            Ambulation/Gait Ambulation/Gait assistance: Min assist;Mod assist Gait Distance (Feet): 400 Feet Assistive device: None Gait Pattern/deviations: Step-through pattern;Drifts right/left Gait  velocity: functional Gait velocity interpretation: 1.31 - 2.62 ft/sec, indicative of limited community ambulator General Gait Details: pt tends to drift toward R side when ambulating, bumping into multiple objects on R side in hallway. Pt with anterior loss of balance requiring modA to correct and multiple lossesof balance requiring minA to correct during dynamic gait tasks including backward stepping, stepping over objects, and turning.  Stairs            Wheelchair Mobility    Modified Rankin (Stroke Patients Only)       Balance Overall balance assessment: Needs assistance Sitting-balance support: No upper extremity supported;Feet supported Sitting balance-Leahy Scale: Good     Standing balance support: No upper extremity supported Standing balance-Leahy Scale: Poor Standing balance comment: minA at times to correct losses of balance                             Pertinent Vitals/Pain Pain Assessment: No/denies pain    Home Living Family/patient expects to be discharged to:: Private residence Living Arrangements: Alone Available Help at Discharge: Neighbor;Available PRN/intermittently Type of Home: House Home Access: Stairs to enter Entrance Stairs-Rails: None Entrance Stairs-Number of Steps: 2 Home Layout: One level;Laundry or work area in [employer]: None      Prior Function Level of Independence: Independent         Comments: 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[employer], works in garden     Higher education careers adviser        Extremity/Trunk Assessment   Upper Extremity Assessment Upper Extremity Assessment: Overall WFL for tasks assessed    Lower Extremity Assessment Lower Extremity Assessment: Overall WFL for tasks assessed    Cervical /  Trunk Assessment Cervical / Trunk Assessment: Normal  Communication   Communication: Expressive difficulties  Cognition Arousal/Alertness: Awake/alert Behavior During Therapy: WFL for tasks  assessed/performed Overall Cognitive Status: Impaired/Different from baseline Area of Impairment: Problem solving;Awareness;Safety/judgement                         Safety/Judgement: Decreased awareness of deficits Awareness: Intellectual Problem Solving: Slow processing        General Comments General comments (skin integrity, edema, etc.): VSS on RA    Exercises     Assessment/Plan    PT Assessment Patient needs continued PT services  PT Problem List Decreased balance;Decreased mobility;Decreased cognition;Decreased knowledge of use of DME;Decreased safety awareness;Decreased knowledge of precautions       PT Treatment Interventions Gait training;DME instruction;Stair training;Functional mobility training;Therapeutic activities;Therapeutic exercise;Balance training;Neuromuscular re-education;Patient/family education;Cognitive remediation    PT Goals (Current goals can be found in the Care Plan section)  Acute Rehab PT Goals Patient Stated Goal: to return to independent mobility and living PT Goal Formulation: With patient Time For Goal Achievement: 08/29/20 Potential to Achieve Goals: Good Additional Goals Additional Goal #1: Pt will score >19/24 on DGI to indicate a reduced risk for falls.    Frequency Min 3X/week   Barriers to discharge        Co-evaluation               AM-PAC PT "6 Clicks" Mobility  Outcome Measure Help needed turning from your back to your side while in a flat bed without using bedrails?: None Help needed moving from lying on your back to sitting on the side of a flat bed without using bedrails?: None Help needed moving to and from a bed to a chair (including a wheelchair)?: A Little Help needed standing up from a chair using your arms (e.g., wheelchair or bedside chair)?: A Little Help needed to walk in hospital room?: A Lot Help needed climbing 3-5 steps with a railing? : A Lot 6 Click Score: 18    End of Session    Activity Tolerance: Patient tolerated treatment well Patient left: in chair;with call bell/phone within reach;with chair alarm set;with family/visitor present Nurse Communication: Mobility status PT Visit Diagnosis: Unsteadiness on feet (R26.81);Other abnormalities of gait and mobility (R26.89)    Time: 7048-8891 PT Time Calculation (min) (ACUTE ONLY): 43 min   Charges:   PT Evaluation $PT Eval Low Complexity: 1 Low PT Treatments $Gait Training: 8-22 mins        Arlyss Gandy, PT, DPT Acute Rehabilitation Pager: [phone]   Arlyss Gandy 08/15/2020, 5:02 PM

## 2020-08-15 NOTE — Progress Notes (Signed)
LTM EEG hooked up and running - no initial skin breakdown - push button tested - neuro notified. [facility] monitoring.  

## 2020-08-15 NOTE — Care Management Obs Status (Signed)
MEDICARE OBSERVATION STATUS NOTIFICATION   Patient Details  Name: SHALIYAH TAITE MRN: [id number] Date of Birth: [birth year]   Medicare Observation Status Notification Given:  Yes    Mearl Latin, LCSW 08/15/2020, 10:01 AM

## 2020-08-15 NOTE — Procedures (Addendum)
Patient Name: Carla White  MRN: [id number]  Epilepsy Attending: Charlsie Quest  Referring Physician/Provider: Dr Bing Neighbors Duration: 08/15/2020 0300 to 08/15/2020 0930   Patient history: 85 year old female who presented with episode of aphasia nausea and bifrontal headaches.  EEG to evaluate for seizures.   Level of alertness: Awake, asleep   AEDs during EEG study: Keppra   Technical aspects: This EEG study was done with scalp electrodes positioned according to the 10-20 International system of electrode placement. Electrical activity was acquired at a sampling rate of 500Hz  and reviewed with a high frequency filter of 70Hz  and a low frequency filter of 1Hz . EEG data were recorded continuously and digitally stored.   Description: The posterior dominant rhythm consists of 9-10 Hz activity of moderate voltage (25-35 uV) seen predominantly in posterior head regions, symmetric and reactive to eye opening and eye closing.  Sleep was characterized by sleep spindles (12 to 14 Hz): No frontocentral region.  EEG showed intermittent generalized and maximal left frontotemporal region 3 to 6 Hz theta-delta slowing. Hyperventilation and photic stimulation were not performed.      ABNORMALITY - Intermittent slow, generalized and maximal left frontotemporal region    IMPRESSION: This study is suggestive of nonspecific cortical dysfunction in left frontotemporal region. Additionally there is evidence of mild diffuse encephalopathy, nonspecific etiology.No seizures or epileptiform discharges were seen throughout the recording.     Aedyn Kempfer 

## 2020-08-15 NOTE — Progress Notes (Signed)
EEG complete - results pending 

## 2020-08-15 NOTE — Progress Notes (Signed)
LTM discontinued; no skin breakdown was seen. 

## 2020-08-15 NOTE — Procedures (Signed)
Patient Name: KEYAIRA CLAPHAM  MRN: [id number]  Epilepsy Attending: Charlsie Quest  Referring Physician/Provider: Dr Bing Neighbors Date: 08/15/2020  Duration: 24.05 mins  Patient history: 85 year old female who presented with episode of aphasia nausea and bifrontal headaches.  EEG to evaluate for seizures.   Level of alertness: Awake  AEDs during EEG study: Keppra  Technical aspects: This EEG study was done with scalp electrodes positioned according to the 10-20 International system of electrode placement. Electrical activity was acquired at a sampling rate of 500Hz  and reviewed with a high frequency filter of 70Hz  and a low frequency filter of 1Hz . EEG data were recorded continuously and digitally stored.   Description: The posterior dominant rhythm consists of 9-10 Hz activity of moderate voltage (25-35 uV) seen predominantly in posterior head regions, symmetric and reactive to eye opening and eye closing. EEG showed intermittent generalized and maximal left frontotemporal region 3 to 6 Hz theta-delta slowing. Hyperventilation and photic stimulation were not performed.     ABNORMALITY - Intermittent slow, generalized and maximal left frontotemporal region   IMPRESSION: This study is suggestive of nonspecific cortical dysfunction in left frontotemporal region. Additionally there is evidence of mild diffuse encephalopathy, nonspecific etiology.No seizures or epileptiform discharges were seen throughout the recording.   Ema Hebner 

## 2020-08-15 NOTE — Progress Notes (Addendum)
PROGRESS NOTE    Carla White  ZOX:[id number] DOB: 11/17/1935 DOA: 08/13/2020 PCP: Lupita Raider, MD    Chief Complaint  Patient presents with   Code Stroke    Brief Narrative Patient is a pleasant 85 year old female with no significant past medical history presenting with episodes of nausea, bifrontal headache, aphasia.  Head CT done, CT angiogram head and neck, CT perfusion study negative for any acute abnormalities.  Patient seen in consultation by neurology and due to concern for seizures patient loaded with IV Keppra and placed on IV Keppra.  MRI brain and MRV brain ordered.  EEG ordered.   Assessment & Plan:   Principal Problem:   Aphasia Active Problems:   Slurred speech   Headache   CVA (cerebral vascular accident) (HCC)   1 bifrontal headache with nausea and vomiting, aphasia -Questionable etiology.  Concern for possible seizures. -Patient with intermittent bouts of aphasia noted during this morning's assessment. -CT head, CT angio head and neck, CT perfusion study with no acute abnormalities. -MRI brain/MRV brain negative for any acute abnormalities. -Patient with no signs or symptoms of infection. -Initial EEG done with nonspecific cortical dysfunction in left frontotemporal region, evidence of mild diffuse encephalopathy, nonspecific etiology, no seizures or epileptiform discharges are seen throughout the recording.   -LTM EEG done and just completed this morning.  -Patient seen in consultation by neurology and due to concerns for seizures was loaded with IV Keppra and placed on IV Keppra twice daily. -Discontinued Zofran and placed on IV Compazine as needed. -PT/OT/SLP. -Neurology following.  2.  Borderline blood pressure -Continue hydration with IV fluids.     DVT prophylaxis: SCDs Code Status: Full Family Communication: Updated patient and daughter Lafonda Mosses ) At bedside..   Disposition:   Status is: Inpatient    Dispo: The patient is from:  Home              Anticipated d/c is to: TBD              Patient currently is not medically stable to d/c.   Difficult to place patient No       Consultants:  Neurology: Dr.Khaliqdina 08/15/2019  Procedures:  CT cerebral perfusion 08/13/2020 CT angiogram head and neck 08/13/2020 CT head 08/13/2020 MRI head 08/14/2020 MR venogram 08/14/2020 EEG 08/15/2020 LTM EEG 08/15/2020   Antimicrobials:  None   Subjective: Patient laying in bed daughter at bedside, patient about to eat a [employer] sandwich.  Patient denies any significant headache, no nausea, no emesis.  Patient noted to have bouts of aphasia during my assessment.    Objective: Vitals:   08/15/20 0003 08/15/20 0500 08/15/20 0758 08/15/20 1158  BP: 99/81 (!) 122/49 (!) 111/59 (!) 129/99  Pulse: 65 62    Resp: 17 16    Temp: 97.9 F (36.6 C) 97.8 F (36.6 C) 98.7 F (37.1 C) 98.6 F (37 C)  TempSrc: Axillary Axillary Oral Oral  SpO2: 98% 97%    Weight:      Height:        Intake/Output Summary (Last 24 hours) at 08/15/2020 1447 Last data filed at 08/15/2020 0538 Gross per 24 hour  Intake 1938.51 ml  Output 1000 ml  Net 938.51 ml    Filed Weights   08/13/20 2345 08/14/20 0311  Weight: 58.1 kg 74.1 kg    Examination:  General exam: : NAD Respiratory system: CTAB anterior lung fields.  No wheezes, no rhonchi.  Normal respiratory effort. Cardiovascular system: Regular rate  and rhythm no murmurs rubs or gallops.  No JVD.  No lower extremity edema.  Gastrointestinal system: Abdomen soft, nontender, nondistended, positive bowel sounds.  No rebound.  No guarding. Central nervous system: Alert and oriented.  Periods of aphasia.  Moving extremities spontaneously.  Extremities: Symmetric 5 x 5 power. Skin: No rashes, lesions or ulcers Psychiatry: Judgement and insight appear fair. Mood & affect appropriate.  Data Reviewed: I have personally reviewed following labs and imaging studies  CBC: Recent Labs  Lab  08/13/20 2306 08/13/20 2314 08/14/20 0618 08/15/20 0203  WBC 11.2*  --  9.2 8.3  NEUTROABS 8.7*  --   --   --   HGB 13.0 13.3 11.9* 12.1  HCT 38.4 39.0 34.3* 35.8*  MCV 95.5  --  96.6 97.0  PLT 352  --  337 305     Basic Metabolic Panel: Recent Labs  Lab 08/13/20 2306 08/13/20 2314 08/13/20 2320 08/14/20 0618 08/15/20 0203  NA 135 135  --  133* 136  K 4.3 4.3  --  4.1 4.3  CL 100 100  --  97* 104  CO2 25  --   --  27 26  GLUCOSE 146* 142*  --  107* 94  BUN 16 17  --  13 10  CREATININE 1.04* 0.90  --  1.03* 1.04*  CALCIUM 9.1  --   --  9.0 8.8*  MG  --   --  2.0  --  2.2     GFR: Estimated Creatinine Clearance: 43.1 mL/min (A) (by C-G formula based on SCr of 1.04 mg/dL (H)).  Liver Function Tests: Recent Labs  Lab 08/13/20 2306  AST 28  ALT 21  ALKPHOS 102  BILITOT 0.8  PROT 7.0  ALBUMIN 3.9     CBG: Recent Labs  Lab 08/13/20 2307  GLUCAP 138*      Recent Results (from the past 240 hour(s))  Resp Panel by RT-PCR (Flu A&B, Covid) Nasopharyngeal Swab     Status: None   Collection Time: 08/13/20 11:06 PM   Specimen: Nasopharyngeal Swab; Nasopharyngeal(NP) swabs in vial transport medium  Result Value Ref Range Status   SARS Coronavirus 2 by RT PCR NEGATIVE NEGATIVE Final    Comment: (NOTE) SARS-CoV-2 target nucleic acids are NOT DETECTED.  The SARS-CoV-2 RNA is generally detectable in upper respiratory specimens during the acute phase of infection. The lowest concentration of SARS-CoV-2 viral copies this assay can detect is 138 copies/mL. A negative result does not preclude SARS-Cov-2 infection and should not be used as the sole basis for treatment or other patient management decisions. A negative result may occur with  improper specimen collection/handling, submission of specimen other than nasopharyngeal swab, presence of viral mutation(s) within the areas targeted by this assay, and inadequate number of viral copies(<138 copies/mL). A  negative result must be combined with clinical observations, patient history, and epidemiological information. The expected result is Negative.  Fact Sheet for Patients:  BloggerCourse.com  Fact Sheet for Healthcare Providers:  SeriousBroker.it  This test is no t yet approved or cleared by the [place] FDA and  has been authorized for detection and/or diagnosis of SARS-CoV-2 by FDA under an Emergency Use Authorization (EUA). This EUA will remain  in effect (meaning this test can be used) for the duration of the COVID-19 declaration under Section 564(b)(1) of the Act, 21 U.S.C.section 360bbb-3(b)(1), unless the authorization is terminated  or revoked sooner.       Influenza A by PCR NEGATIVE NEGATIVE  Final   Influenza B by PCR NEGATIVE NEGATIVE Final    Comment: (NOTE) The Xpert Xpress SARS-CoV-2/FLU/RSV plus assay is intended as an aid in the diagnosis of influenza from Nasopharyngeal swab specimens and should not be used as a sole basis for treatment. Nasal washings and aspirates are unacceptable for Xpert Xpress SARS-CoV-2/FLU/RSV testing.  Fact Sheet for Patients: BloggerCourse.com  Fact Sheet for Healthcare Providers: SeriousBroker.it  This test is not yet approved or cleared by the [place] FDA and has been authorized for detection and/or diagnosis of SARS-CoV-2 by FDA under an Emergency Use Authorization (EUA). This EUA will remain in effect (meaning this test can be used) for the duration of the COVID-19 declaration under Section 564(b)(1) of the Act, 21 U.S.C. section 360bbb-3(b)(1), unless the authorization is terminated or revoked.  Performed at [facility], [address] [place], [place] [address]           Radiology Studies: MR BRAIN WO CONTRAST  Result Date: 08/14/2020 CLINICAL DATA:  Acute stroke suspected EXAM: MRI HEAD WITHOUT  CONTRAST TECHNIQUE: Multiplanar, multiecho pulse sequences of the brain and surrounding structures were obtained without intravenous contrast. COMPARISON:  Head CT and CTA from yesterday FINDINGS: Brain: No acute infarction, hemorrhage, hydrocephalus, extra-axial collection or mass lesion. Chronic small vessel ischemia in the cerebral white matter that is moderate to extensive. Age normal brain volume. Small remote right cerebellar infarct. Vascular: Preserved flow voids.  There was preceding CTA. Skull and upper cervical spine: Normal marrow signal Sinuses/Orbits: Bilateral cataract resection. Other: Progressively motion degraded. IMPRESSION: 1. Motion degraded brain MRI without acute finding. 2. Extensive chronic small vessel ischemia. Electronically Signed   By: Marnee Spring M.D.   On: 08/14/2020 05:43   MR Venogram Head  Result Date: 08/14/2020 CLINICAL DATA:  Dural sinus thrombosis suspected. EXAM: MR VENOGRAM HEAD WITHOUT CONTRAST TECHNIQUE: Angiographic images of the intracranial venous structures were acquired using MRV technique without intravenous contrast. COMPARISON:  Brain MRI performed at the same time FINDINGS: Patent dural sinuses and symmetric deep veins. No evidence of cortical vein thrombosis. IMPRESSION: Negative for dural sinus thrombosis. Electronically Signed   By: Marnee Spring M.D.   On: 08/14/2020 05:45   CT CEREBRAL PERFUSION W CONTRAST  Result Date: 08/14/2020 CLINICAL DATA:  Initial evaluation for acute aphasia. EXAM: CT ANGIOGRAPHY HEAD AND NECK CT PERFUSION BRAIN TECHNIQUE: Multidetector CT imaging of the head and neck was performed using the standard protocol during bolus administration of intravenous contrast. Multiplanar CT image reconstructions and MIPs were obtained to evaluate the vascular anatomy. Carotid stenosis measurements (when applicable) are obtained utilizing NASCET criteria, using the distal internal carotid diameter as the denominator. Multiphase CT imaging  of the brain was performed following IV bolus contrast injection. Subsequent parametric perfusion maps were calculated using RAPID software. CONTRAST:  OMNIPAQUE IOHEXOL 350 MG/ML SOLN COMPARISON:  None available. FINDINGS: CT HEAD FINDINGS Brain: Generalized age-related cerebral atrophy with moderate chronic microvascular ischemic disease. No acute intracranial hemorrhage. No acute large vessel territory infarct. No mass lesion or midline shift. No hydrocephalus or extra-axial fluid collection. Vascular: No hyperdense vessel. Scattered vascular calcifications noted within the carotid siphons. Skull: No visible scalp soft tissue abnormality.  Calvarium intact. Sinuses/Orbits: Globes and orbital soft tissues demonstrate no acute finding. Paranasal sinuses are largely clear. Sequelae of chronic left maxillary sinusitis noted. No mastoid effusion. Other: None. ASPECTS [facility] Stroke Program Early CT Score) - Ganglionic level infarction (caudate, lentiform nuclei, internal capsule, insula, M1-M3 cortex): 7 -  Supraganglionic infarction (M4-M6 cortex): 3 Total score (0-10 with 10 being normal): 10 Review of the MIP images confirms the above findings CTA NECK FINDINGS Aortic arch: Visualized aortic arch normal caliber with normal 3 vessel morphology. Mild atheromatous change about the arch itself. No hemodynamically significant stenosis about the origin the great vessels. Right carotid system: Right common and internal carotid arteries are diffusely tortuous but widely patent without stenosis, dissection or occlusion. Left carotid system: Left CCA tortuous but is widely patent to the bifurcation. Eccentric soft plaque at the proximal left ICA without significant stenosis. Left ICA mildly tortuous but widely patent distally without stenosis, dissection or occlusion. Vertebral arteries: Both vertebral arteries arise from the subclavian arteries. No proximal subclavian artery stenosis. Left vertebral artery dominant.  Vertebral arteries are tortuous but widely patent to the skull base without stenosis, dissection or occlusion. Skeleton: No visible acute osseous abnormality. Mild chronic height loss noted at the T4 vertebral body. No discrete or worrisome osseous lesions. Moderate spondylosis present at C4-5 through C6-7. Torus mandibularis noted. Other neck: No other acute soft tissue abnormality within the neck. No mass or adenopathy. Upper chest: Visualized upper chest demonstrates no acute finding. Review of the MIP images confirms the above findings CTA HEAD FINDINGS Anterior circulation: Petrous segments widely patent. Mild atheromatous change within the carotid siphons without hemodynamically significant stenosis. ICAs are somewhat dolichoectatic through the carotid siphons. A1 segments patent bilaterally. Normal anterior communicating artery complex. Right ACA widely patent. Short-segment moderate left A2 stenosis noted (series 8, image 79). No M1 stenosis or occlusion. Normal MCA bifurcations. Distal MCA branches well perfused and symmetric. Patent Posterior circulation: Both vertebral arteries patent to the vertebrobasilar junction without stenosis. Left vertebral artery dominant. Both PICA origins patent and normal. Basilar patent to its distal aspect without stenosis. Superior cerebellar arteries patent bilaterally. Right PCA supplied via the basilar. Multifocal moderate stenoses noted involving the mid and distal right P2 segment. Fetal type origin of the left PCA. Left PCA well perfused to its distal aspect without stenosis. Venous sinuses: Patent allowing for timing of the contrast bolus. Anatomic variants: Fetal type origin of the left PCA. Dominant left vertebral artery. No intracranial aneurysm. Review of the MIP images confirms the above findings CT Brain Perfusion Findings: ASPECTS: 10 CBF (<30%) Volume: 64mL Perfusion (Tmax>6.0s) volume: 57mL Mismatch Volume: 95mL Infarction Location:No evidence for acute  ischemia by CT perfusion. Apparent 3 cc perfusion deficit at the posterior right parietal vertex favored to be artifactual. No definite perfusion abnormality. IMPRESSION: CT HEAD IMPRESSION: 1. No acute intracranial abnormality identified. 2. Aspects = 10. 3. Age-related cerebral atrophy with moderate chronic microvascular ischemic disease. CTA HEAD AND NECK IMPRESSION: 1. Negative CTA for emergent large vessel occlusion. 2. Intracranial atherosclerotic disease with associated moderate left A2 and right P2 stenoses. No other hemodynamically significant or correctable stenosis. 3. Diffuse tortuosity of the major arterial vasculature of the head and neck, suggesting chronic underlying hypertension. CT PERFUSION IMPRESSION: Negative CT perfusion. No evidence for acute core infarct or other perfusion abnormality. Critical Value/emergent results were called by telephone at the time of interpretation on 08/13/2020 at 11:25 pm and again at 11:40 pm to provider [facility], who verbally acknowledged these results. Electronically Signed   By: Rise Mu M.D.   On: 08/14/2020 00:17   EEG adult  Result Date: 08/15/2020 Charlsie Quest, MD     08/15/2020  8:37 AM Patient Name: Carla White MRN: [id number] Epilepsy Attending: Charlsie Quest Referring Physician/Provider: Dr  Bing Neighbors Date: 08/15/2020 Duration: 24.05 mins Patient history: 85 year old female who presented with episode of aphasia nausea and bifrontal headaches.  EEG to evaluate for seizures. Level of alertness: Awake AEDs during EEG study: Keppra Technical aspects: This EEG study was done with scalp electrodes positioned according to the 10-20 International system of electrode placement. Electrical activity was acquired at a sampling rate of  and reviewed with a high frequency filter of  and a low frequency filter of . EEG data were recorded continuously and digitally stored. Description: The posterior dominant rhythm consists  of 9-10 Hz activity of moderate voltage (25-35 uV) seen predominantly in posterior head regions, symmetric and reactive to eye opening and eye closing. EEG showed intermittent generalized and maximal left frontotemporal region 3 to 6 Hz theta-delta slowing. Hyperventilation and photic stimulation were not performed.   ABNORMALITY - Intermittent slow, generalized and maximal left frontotemporal region IMPRESSION: This study is suggestive of nonspecific cortical dysfunction in left frontotemporal region. Additionally there is evidence of mild diffuse encephalopathy, nonspecific etiology.No seizures or epileptiform discharges were seen throughout the recording. Priyanka Annabelle Harman   Overnight EEG with video  Result Date: 08/15/2020 Charlsie Quest, MD     08/15/2020 10:56 AM Patient Name: Carla White MRN: [id number] Epilepsy Attending: Charlsie Quest Referring Physician/Provider: Dr Bing Neighbors Duration: 08/15/2020 0300 to 08/15/2020 0930  Patient history: 85 year old female who presented with episode of aphasia nausea and bifrontal headaches.  EEG to evaluate for seizures.  Level of alertness: Awake, asleep  AEDs during EEG study: Keppra  Technical aspects: This EEG study was done with scalp electrodes positioned according to the 10-20 International system of electrode placement. Electrical activity was acquired at a sampling rate of  and reviewed with a high frequency filter of  and a low frequency filter of . EEG data were recorded continuously and digitally stored.  Description: The posterior dominant rhythm consists of 9-10 Hz activity of moderate voltage (25-35 uV) seen predominantly in posterior head regions, symmetric and reactive to eye opening and eye closing.  Sleep was characterized by sleep spindles (12 to 14 Hz): No frontocentral region.  EEG showed intermittent generalized and maximal left frontotemporal region 3 to 6 Hz theta-delta slowing. Hyperventilation and photic stimulation  were not performed.    ABNORMALITY - Intermittent slow, generalized and maximal left frontotemporal region  IMPRESSION: This study is suggestive of nonspecific cortical dysfunction in left frontotemporal region. Additionally there is evidence of mild diffuse encephalopathy, nonspecific etiology.No seizures or epileptiform discharges were seen throughout the recording.   Priyanka Annabelle Harman  CT HEAD CODE STROKE WO CONTRAST  Result Date: 08/14/2020 CLINICAL DATA:  Initial evaluation for acute aphasia. EXAM: CT ANGIOGRAPHY HEAD AND NECK CT PERFUSION BRAIN TECHNIQUE: Multidetector CT imaging of the head and neck was performed using the standard protocol during bolus administration of intravenous contrast. Multiplanar CT image reconstructions and MIPs were obtained to evaluate the vascular anatomy. Carotid stenosis measurements (when applicable) are obtained utilizing NASCET criteria, using the distal internal carotid diameter as the denominator. Multiphase CT imaging of the brain was performed following IV bolus contrast injection. Subsequent parametric perfusion maps were calculated using RAPID software. CONTRAST:  OMNIPAQUE IOHEXOL 350 MG/ML SOLN COMPARISON:  None available. FINDINGS: CT HEAD FINDINGS Brain: Generalized age-related cerebral atrophy with moderate chronic microvascular ischemic disease. No acute intracranial hemorrhage. No acute large vessel territory infarct. No mass lesion or midline shift. No hydrocephalus or extra-axial fluid collection. Vascular: No hyperdense vessel. Scattered vascular  calcifications noted within the carotid siphons. Skull: No visible scalp soft tissue abnormality.  Calvarium intact. Sinuses/Orbits: Globes and orbital soft tissues demonstrate no acute finding. Paranasal sinuses are largely clear. Sequelae of chronic left maxillary sinusitis noted. No mastoid effusion. Other: None. ASPECTS ([place] Stroke Program Early CT Score) - Ganglionic level infarction (caudate,  lentiform nuclei, internal capsule, insula, M1-M3 cortex): 7 - Supraganglionic infarction (M4-M6 cortex): 3 Total score (0-10 with 10 being normal): 10 Review of the MIP images confirms the above findings CTA NECK FINDINGS Aortic arch: Visualized aortic arch normal caliber with normal 3 vessel morphology. Mild atheromatous change about the arch itself. No hemodynamically significant stenosis about the origin the great vessels. Right carotid system: Right common and internal carotid arteries are diffusely tortuous but widely patent without stenosis, dissection or occlusion. Left carotid system: Left CCA tortuous but is widely patent to the bifurcation. Eccentric soft plaque at the proximal left ICA without significant stenosis. Left ICA mildly tortuous but widely patent distally without stenosis, dissection or occlusion. Vertebral arteries: Both vertebral arteries arise from the subclavian arteries. No proximal subclavian artery stenosis. Left vertebral artery dominant. Vertebral arteries are tortuous but widely patent to the skull base without stenosis, dissection or occlusion. Skeleton: No visible acute osseous abnormality. Mild chronic height loss noted at the T4 vertebral body. No discrete or worrisome osseous lesions. Moderate spondylosis present at C4-5 through C6-7. Torus mandibularis noted. Other neck: No other acute soft tissue abnormality within the neck. No mass or adenopathy. Upper chest: Visualized upper chest demonstrates no acute finding. Review of the MIP images confirms the above findings CTA HEAD FINDINGS Anterior circulation: Petrous segments widely patent. Mild atheromatous change within the carotid siphons without hemodynamically significant stenosis. ICAs are somewhat dolichoectatic through the carotid siphons. A1 segments patent bilaterally. Normal anterior communicating artery complex. Right ACA widely patent. Short-segment moderate left A2 stenosis noted (series 8, image 79). No M1 stenosis  or occlusion. Normal MCA bifurcations. Distal MCA branches well perfused and symmetric. Patent Posterior circulation: Both vertebral arteries patent to the vertebrobasilar junction without stenosis. Left vertebral artery dominant. Both PICA origins patent and normal. Basilar patent to its distal aspect without stenosis. Superior cerebellar arteries patent bilaterally. Right PCA supplied via the basilar. Multifocal moderate stenoses noted involving the mid and distal right P2 segment. Fetal type origin of the left PCA. Left PCA well perfused to its distal aspect without stenosis. Venous sinuses: Patent allowing for timing of the contrast bolus. Anatomic variants: Fetal type origin of the left PCA. Dominant left vertebral artery. No intracranial aneurysm. Review of the MIP images confirms the above findings CT Brain Perfusion Findings: ASPECTS: 10 CBF (<30%) Volume: 0mL Perfusion (Tmax>6.0s) volume: 3mL Mismatch Volume: 3mL Infarction Location:No evidence for acute ischemia by CT perfusion. Apparent 3 cc perfusion deficit at the posterior right parietal vertex favored to be artifactual. No definite perfusion abnormality. IMPRESSION: CT HEAD IMPRESSION: 1. No acute intracranial abnormality identified. 2. Aspects = 10. 3. Age-related cerebral atrophy with moderate chronic microvascular ischemic disease. CTA HEAD AND NECK IMPRESSION: 1. Negative CTA for emergent large vessel occlusion. 2. Intracranial atherosclerotic disease with associated moderate left A2 and right P2 stenoses. No other hemodynamically significant or correctable stenosis. 3. Diffuse tortuosity of the major arterial vasculature of the head and neck, suggesting chronic underlying hypertension. CT PERFUSION IMPRESSION: Negative CT perfusion. No evidence for acute core infarct or other perfusion abnormality. Critical Value/emergent results were called by telephone at the time of interpretation on 08/13/2020 at 11:25  pm and again at 11:40 pm to provider  [facility], who verbally acknowledged these results. Electronically Signed   By: Rise MuBenjamin  McClintock M.D.   On: 08/14/2020 00:17   CT ANGIO HEAD CODE STROKE  Result Date: 08/14/2020 CLINICAL DATA:  Initial evaluation for acute aphasia. EXAM: CT ANGIOGRAPHY HEAD AND NECK CT PERFUSION BRAIN TECHNIQUE: Multidetector CT imaging of the head and neck was performed using the standard protocol during bolus administration of intravenous contrast. Multiplanar CT image reconstructions and MIPs were obtained to evaluate the vascular anatomy. Carotid stenosis measurements (when applicable) are obtained utilizing NASCET criteria, using the distal internal carotid diameter as the denominator. Multiphase CT imaging of the brain was performed following IV bolus contrast injection. Subsequent parametric perfusion maps were calculated using RAPID software. CONTRAST:  100mL OMNIPAQUE IOHEXOL 350 MG/ML SOLN COMPARISON:  None available. FINDINGS: CT HEAD FINDINGS Brain: Generalized age-related cerebral atrophy with moderate chronic microvascular ischemic disease. No acute intracranial hemorrhage. No acute large vessel territory infarct. No mass lesion or midline shift. No hydrocephalus or extra-axial fluid collection. Vascular: No hyperdense vessel. Scattered vascular calcifications noted within the carotid siphons. Skull: No visible scalp soft tissue abnormality.  Calvarium intact. Sinuses/Orbits: Globes and orbital soft tissues demonstrate no acute finding. Paranasal sinuses are largely clear. Sequelae of chronic left maxillary sinusitis noted. No mastoid effusion. Other: None. ASPECTS (Alberta Stroke Program Early CT Score) - Ganglionic level infarction (caudate, lentiform nuclei, internal capsule, insula, M1-M3 cortex): 7 - Supraganglionic infarction (M4-M6 cortex): 3 Total score (0-10 with 10 being normal): 10 Review of the MIP images confirms the above findings CTA NECK FINDINGS Aortic arch: Visualized aortic arch normal  caliber with normal 3 vessel morphology. Mild atheromatous change about the arch itself. No hemodynamically significant stenosis about the origin the great vessels. Right carotid system: Right common and internal carotid arteries are diffusely tortuous but widely patent without stenosis, dissection or occlusion. Left carotid system: Left CCA tortuous but is widely patent to the bifurcation. Eccentric soft plaque at the proximal left ICA without significant stenosis. Left ICA mildly tortuous but widely patent distally without stenosis, dissection or occlusion. Vertebral arteries: Both vertebral arteries arise from the subclavian arteries. No proximal subclavian artery stenosis. Left vertebral artery dominant. Vertebral arteries are tortuous but widely patent to the skull base without stenosis, dissection or occlusion. Skeleton: No visible acute osseous abnormality. Mild chronic height loss noted at the T4 vertebral body. No discrete or worrisome osseous lesions. Moderate spondylosis present at C4-5 through C6-7. Torus mandibularis noted. Other neck: No other acute soft tissue abnormality within the neck. No mass or adenopathy. Upper chest: Visualized upper chest demonstrates no acute finding. Review of the MIP images confirms the above findings CTA HEAD FINDINGS Anterior circulation: Petrous segments widely patent. Mild atheromatous change within the carotid siphons without hemodynamically significant stenosis. ICAs are somewhat dolichoectatic through the carotid siphons. A1 segments patent bilaterally. Normal anterior communicating artery complex. Right ACA widely patent. Short-segment moderate left A2 stenosis noted (series 8, image 79). No M1 stenosis or occlusion. Normal MCA bifurcations. Distal MCA branches well perfused and symmetric. Patent Posterior circulation: Both vertebral arteries patent to the vertebrobasilar junction without stenosis. Left vertebral artery dominant. Both PICA origins patent and normal.  Basilar patent to its distal aspect without stenosis. Superior cerebellar arteries patent bilaterally. Right PCA supplied via the basilar. Multifocal moderate stenoses noted involving the mid and distal right P2 segment. Fetal type origin of the left PCA. Left PCA well perfused to its distal aspect  without stenosis. Venous sinuses: Patent allowing for timing of the contrast bolus. Anatomic variants: Fetal type origin of the left PCA. Dominant left vertebral artery. No intracranial aneurysm. Review of the MIP images confirms the above findings CT Brain Perfusion Findings: ASPECTS: 10 CBF (<30%) Volume: 0mL Perfusion (Tmax>6.0s) volume: 3mL Mismatch Volume: 3mL Infarction Location:No evidence for acute ischemia by CT perfusion. Apparent 3 cc perfusion deficit at the posterior right parietal vertex favored to be artifactual. No definite perfusion abnormality. IMPRESSION: CT HEAD IMPRESSION: 1. No acute intracranial abnormality identified. 2. Aspects = 10. 3. Age-related cerebral atrophy with moderate chronic microvascular ischemic disease. CTA HEAD AND NECK IMPRESSION: 1. Negative CTA for emergent large vessel occlusion. 2. Intracranial atherosclerotic disease with associated moderate left A2 and right P2 stenoses. No other hemodynamically significant or correctable stenosis. 3. Diffuse tortuosity of the major arterial vasculature of the head and neck, suggesting chronic underlying hypertension. CT PERFUSION IMPRESSION: Negative CT perfusion. No evidence for acute core infarct or other perfusion abnormality. Critical Value/emergent results were called by telephone at the time of interpretation on 08/13/2020 at 11:25 pm and again at 11:40 pm to provider [facility], who verbally acknowledged these results. Electronically Signed   By: Rise Mu M.D.   On: 08/14/2020 00:17   CT ANGIO NECK CODE STROKE  Result Date: 08/14/2020 CLINICAL DATA:  Initial evaluation for acute aphasia. EXAM: CT ANGIOGRAPHY HEAD  AND NECK CT PERFUSION BRAIN TECHNIQUE: Multidetector CT imaging of the head and neck was performed using the standard protocol during bolus administration of intravenous contrast. Multiplanar CT image reconstructions and MIPs were obtained to evaluate the vascular anatomy. Carotid stenosis measurements (when applicable) are obtained utilizing NASCET criteria, using the distal internal carotid diameter as the denominator. Multiphase CT imaging of the brain was performed following IV bolus contrast injection. Subsequent parametric perfusion maps were calculated using RAPID software. CONTRAST:  OMNIPAQUE IOHEXOL 350 MG/ML SOLN COMPARISON:  None available. FINDINGS: CT HEAD FINDINGS Brain: Generalized age-related cerebral atrophy with moderate chronic microvascular ischemic disease. No acute intracranial hemorrhage. No acute large vessel territory infarct. No mass lesion or midline shift. No hydrocephalus or extra-axial fluid collection. Vascular: No hyperdense vessel. Scattered vascular calcifications noted within the carotid siphons. Skull: No visible scalp soft tissue abnormality.  Calvarium intact. Sinuses/Orbits: Globes and orbital soft tissues demonstrate no acute finding. Paranasal sinuses are largely clear. Sequelae of chronic left maxillary sinusitis noted. No mastoid effusion. Other: None. ASPECTS (Alberta Stroke Program Early CT Score) - Ganglionic level infarction (caudate, lentiform nuclei, internal capsule, insula, M1-M3 cortex): 7 - Supraganglionic infarction (M4-M6 cortex): 3 Total score (0-10 with 10 being normal): 10 Review of the MIP images confirms the above findings CTA NECK FINDINGS Aortic arch: Visualized aortic arch normal caliber with normal 3 vessel morphology. Mild atheromatous change about the arch itself. No hemodynamically significant stenosis about the origin the great vessels. Right carotid system: Right common and internal carotid arteries are diffusely tortuous but widely patent  without stenosis, dissection or occlusion. Left carotid system: Left CCA tortuous but is widely patent to the bifurcation. Eccentric soft plaque at the proximal left ICA without significant stenosis. Left ICA mildly tortuous but widely patent distally without stenosis, dissection or occlusion. Vertebral arteries: Both vertebral arteries arise from the subclavian arteries. No proximal subclavian artery stenosis. Left vertebral artery dominant. Vertebral arteries are tortuous but widely patent to the skull base without stenosis, dissection or occlusion. Skeleton: No visible acute osseous abnormality. Mild chronic height loss noted at the  T4 vertebral body. No discrete or worrisome osseous lesions. Moderate spondylosis present at C4-5 through C6-7. Torus mandibularis noted. Other neck: No other acute soft tissue abnormality within the neck. No mass or adenopathy. Upper chest: Visualized upper chest demonstrates no acute finding. Review of the MIP images confirms the above findings CTA HEAD FINDINGS Anterior circulation: Petrous segments widely patent. Mild atheromatous change within the carotid siphons without hemodynamically significant stenosis. ICAs are somewhat dolichoectatic through the carotid siphons. A1 segments patent bilaterally. Normal anterior communicating artery complex. Right ACA widely patent. Short-segment moderate left A2 stenosis noted (series 8, image 79). No M1 stenosis or occlusion. Normal MCA bifurcations. Distal MCA branches well perfused and symmetric. Patent Posterior circulation: Both vertebral arteries patent to the vertebrobasilar junction without stenosis. Left vertebral artery dominant. Both PICA origins patent and normal. Basilar patent to its distal aspect without stenosis. Superior cerebellar arteries patent bilaterally. Right PCA supplied via the basilar. Multifocal moderate stenoses noted involving the mid and distal right P2 segment. Fetal type origin of the left PCA. Left PCA well  perfused to its distal aspect without stenosis. Venous sinuses: Patent allowing for timing of the contrast bolus. Anatomic variants: Fetal type origin of the left PCA. Dominant left vertebral artery. No intracranial aneurysm. Review of the MIP images confirms the above findings CT Brain Perfusion Findings: ASPECTS: 10 CBF (<30%) Volume: 74mL Perfusion (Tmax>6.0s) volume: 57mL Mismatch Volume: 80mL Infarction Location:No evidence for acute ischemia by CT perfusion. Apparent 3 cc perfusion deficit at the posterior right parietal vertex favored to be artifactual. No definite perfusion abnormality. IMPRESSION: CT HEAD IMPRESSION: 1. No acute intracranial abnormality identified. 2. Aspects = 10. 3. Age-related cerebral atrophy with moderate chronic microvascular ischemic disease. CTA HEAD AND NECK IMPRESSION: 1. Negative CTA for emergent large vessel occlusion. 2. Intracranial atherosclerotic disease with associated moderate left A2 and right P2 stenoses. No other hemodynamically significant or correctable stenosis. 3. Diffuse tortuosity of the major arterial vasculature of the head and neck, suggesting chronic underlying hypertension. CT PERFUSION IMPRESSION: Negative CT perfusion. No evidence for acute core infarct or other perfusion abnormality. Critical Value/emergent results were called by telephone at the time of interpretation on 08/13/2020 at 11:25 pm and again at 11:40 pm to provider [facility], who verbally acknowledged these results. Electronically Signed   By: Rise Mu M.D.   On: 08/14/2020 00:17        Scheduled Meds: Continuous Infusions:  sodium chloride Stopped (08/15/20 0536)   levETIRAcetam 400 mL/hr at 08/15/20 0538     LOS: 0 days    Time spent: 35 minutes     Ramiro Harvest, MD [facility]   To contact the attending provider between 7A-7P or the covering provider during after hours 7P-7A, please log into the web site [email or web address] and access using  universal [place] password for that web site. If you do not have the password, please call the hospital operator.  08/15/2020, 2:47 PM

## 2020-08-16 ENCOUNTER — Inpatient Hospital Stay (HOSPITAL_COMMUNITY): Payer: PPO

## 2020-08-16 LAB — BASIC METABOLIC PANEL
Anion gap: 8 (ref 5–15)
BUN: 8 mg/dL (ref 8–23)
CO2: 23 mmol/L (ref 22–32)
Calcium: 8.9 mg/dL (ref 8.9–10.3)
Chloride: 105 mmol/L (ref 98–111)
Creatinine, Ser: 1.02 mg/dL — ABNORMAL HIGH (ref 0.44–1.00)
GFR, Estimated: 55 mL/min — ABNORMAL LOW (ref >60)
Glucose, Bld: 92 mg/dL (ref 70–99)
Potassium: 4.6 mmol/L (ref 3.5–5.1)
Sodium: 136 mmol/L (ref 135–145)

## 2020-08-16 LAB — MAGNESIUM: Magnesium: 2 mg/dL (ref 1.7–2.4)

## 2020-08-16 IMAGING — MR MR HEAD WO/W CM
7 of 13 series · 23 of 48 positions shown · IV contrast (gadavist)
Comparison: [DATE]

CLINICAL DATA: TIA, episodes of nausea, headache, aphasia

EXAM:
MRI HEAD WITHOUT AND WITH CONTRAST
TECHNIQUE: Multiplanar, multiecho pulse sequences of the brain and surrounding
structures were obtained without and with intravenous contrast.
CONTRAST:  7.5mL GADAVIST GADOBUTROL 1 MMOL/ML IV SOLN

[Series 2: DWI · axial · 3.0mm · 0.94mm/px · z∈[-50,+88]mm · 8 of 100 slices shown (1 of 2)]
[im 1/100]
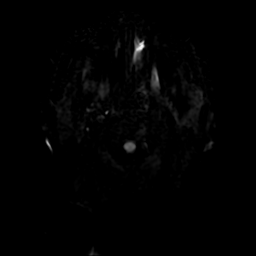
[im 15/100]
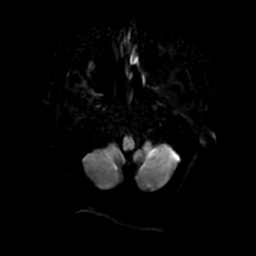
[im 29/100]
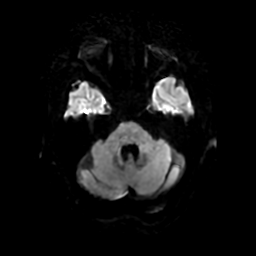
[im 43/100]
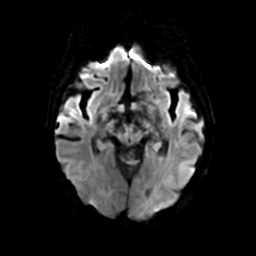
[im 57/100]
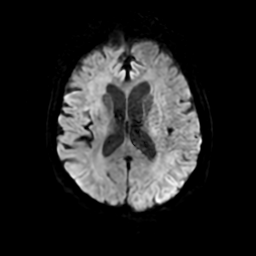
[im 71/100]
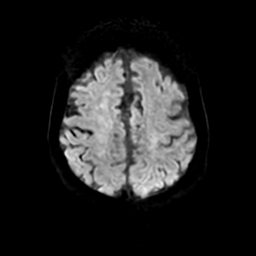
[im 85/100]
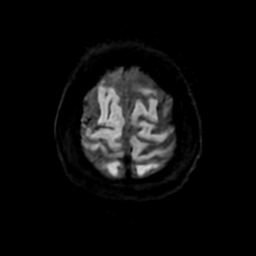
[im 100/100]
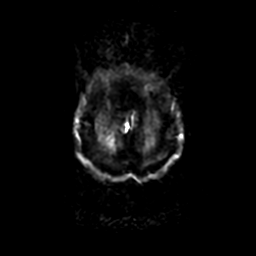

[Series 3: DWI · coronal · 4.0mm · 0.94mm/px · 5 of 73 slices shown (2 of 2)]
[im 1/73]
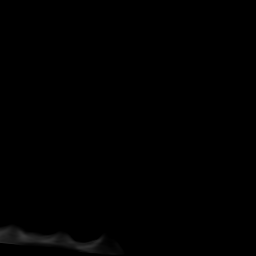
[im 19/73]
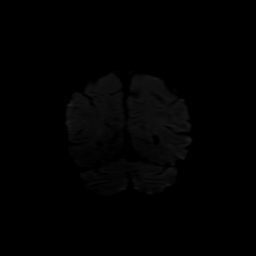
[im 37/73]
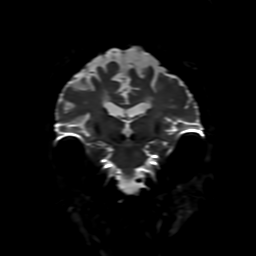
[im 55/73]
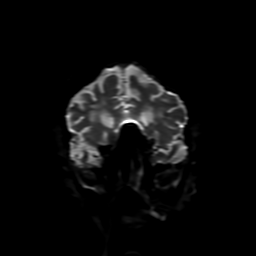
[im 73/73]
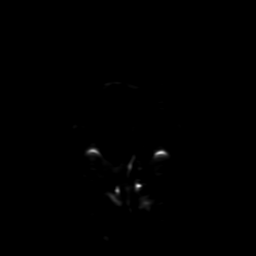

[Series 4: FLAIR · sagittal · 5.0mm · 0.23mm/px · 2 of 24 slices shown (1 of 2)]
[im 1/24]
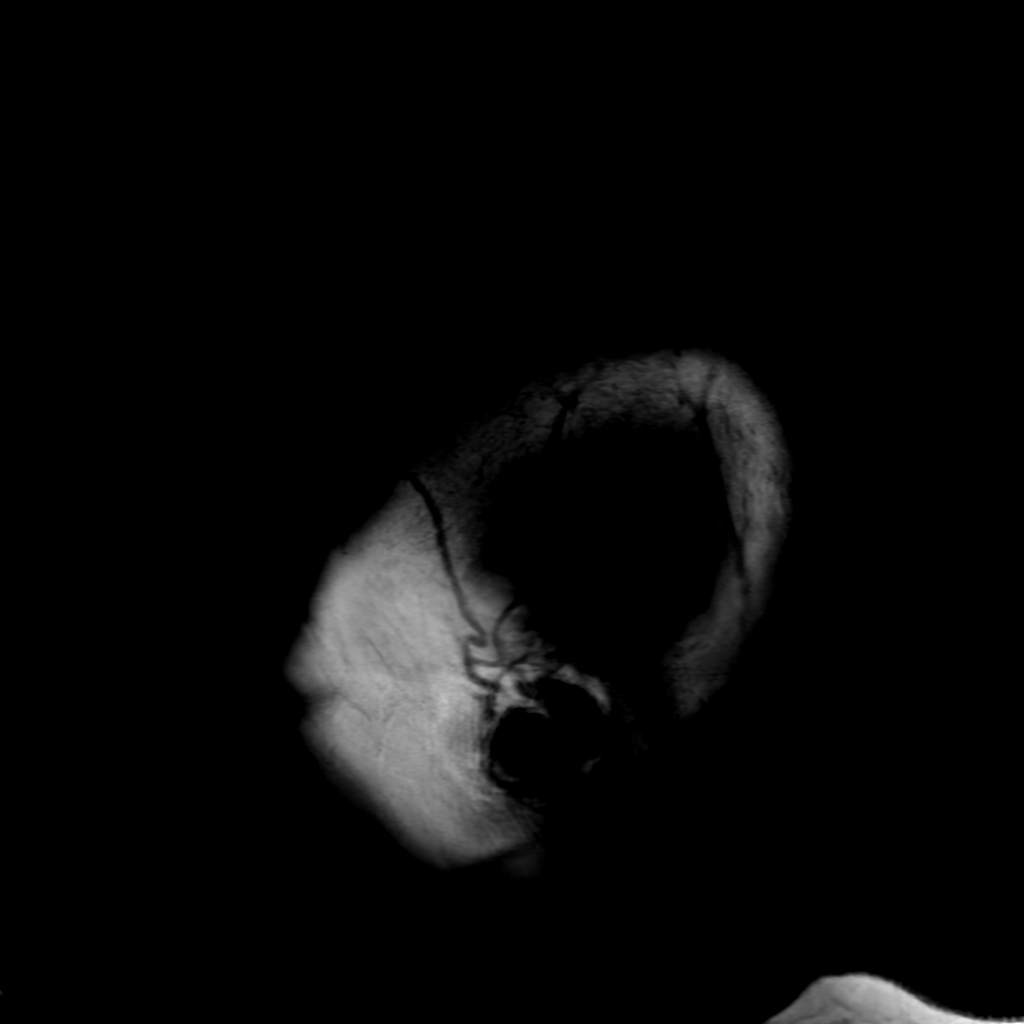
[im 24/24]
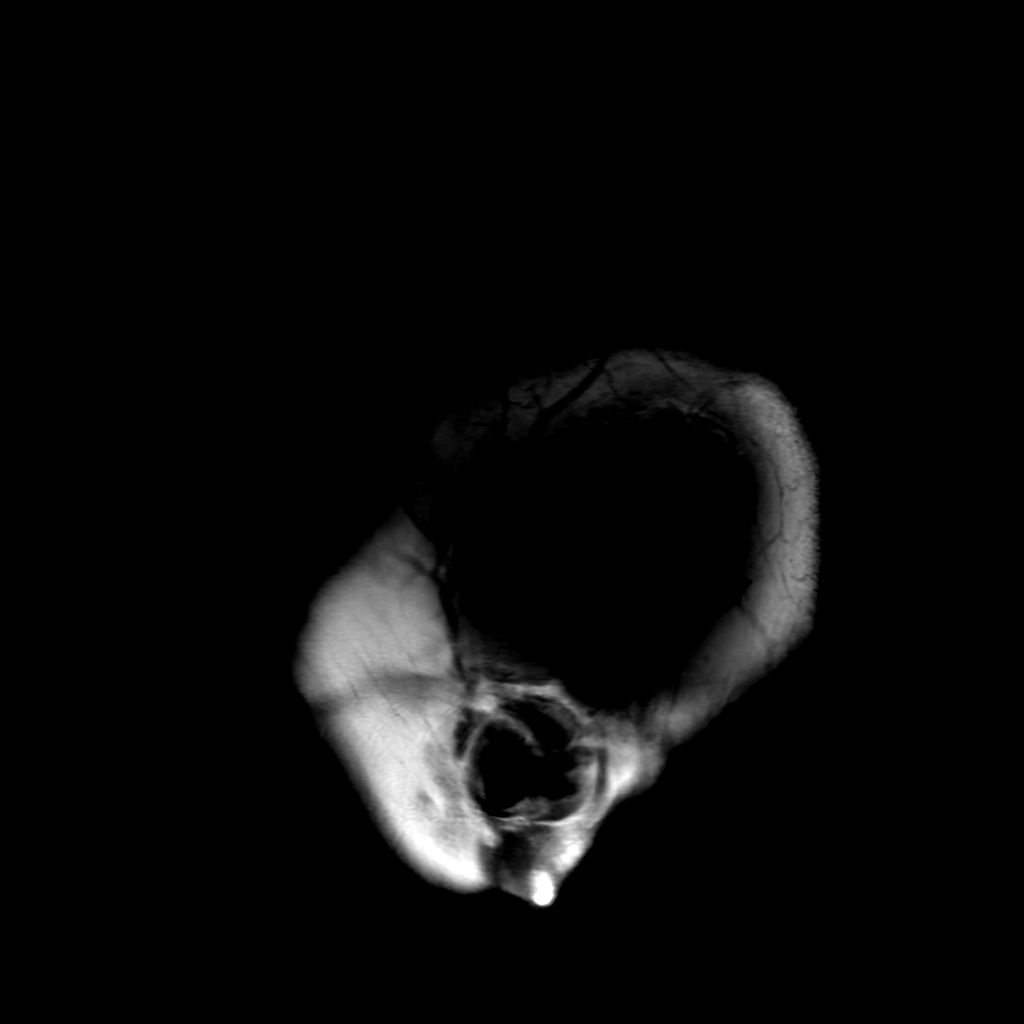

[Series 5: T2 · axial · 5.0mm · 0.23mm/px · 1 of 25 slices shown]
[im 1/25]
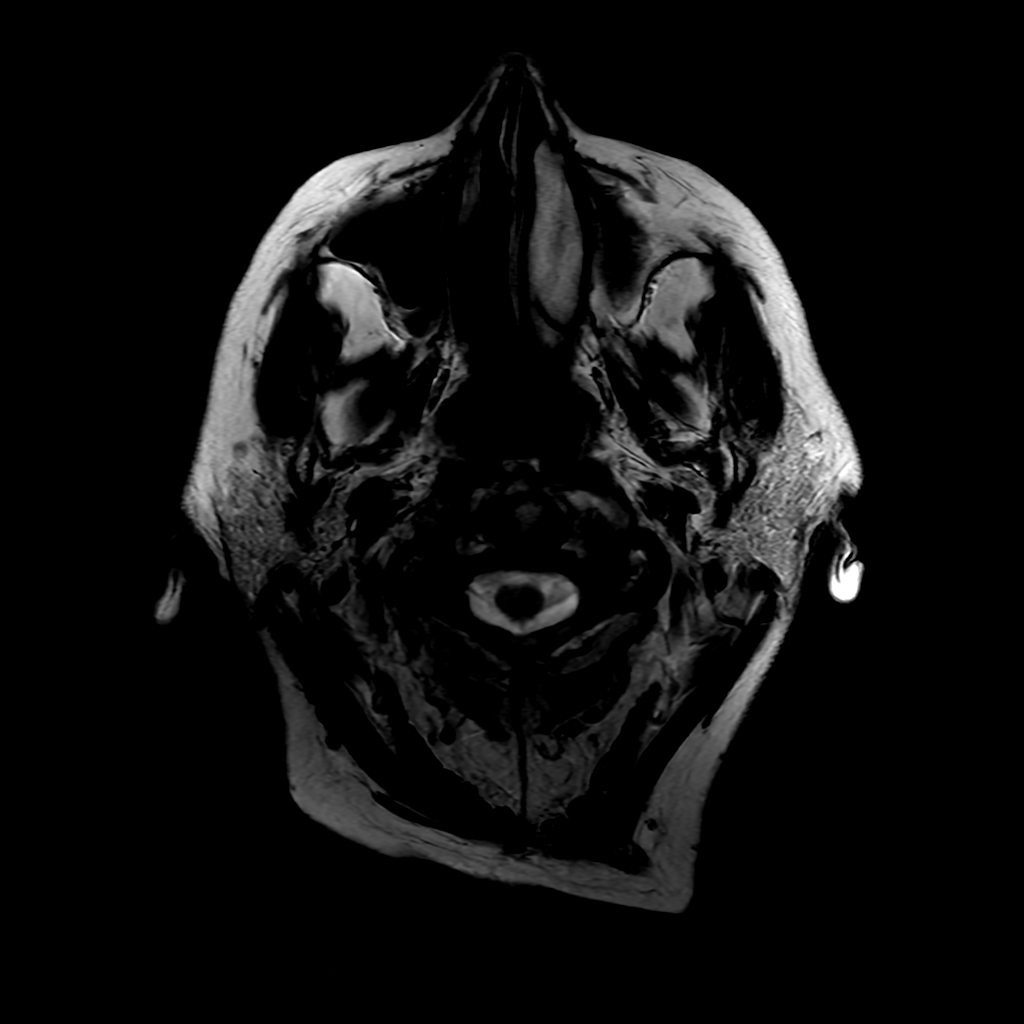

[Series 6: FLAIR · axial · 4.0mm · 0.45mm/px · z∈[-51,+86]mm · 2 of 34 slices shown (2 of 2)]
[im 1/34]
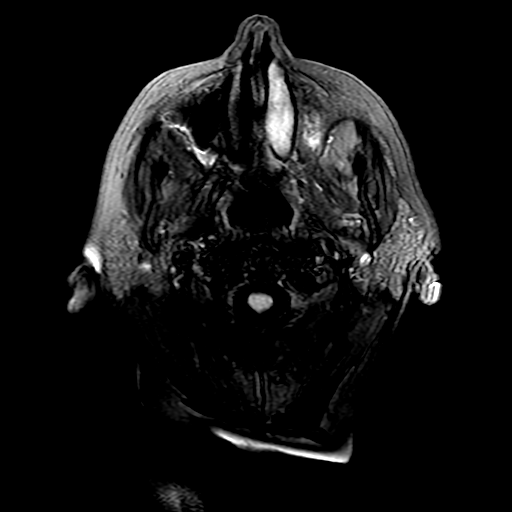
[im 34/34]
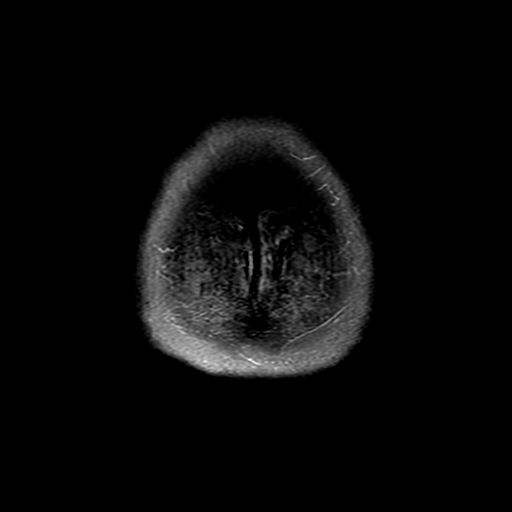

[Series 250: ADC · axial · 3.0mm · 0.94mm/px · z∈[-50,+88]mm · 3 of 49 slices shown (1 of 2)]
[im 1/49]
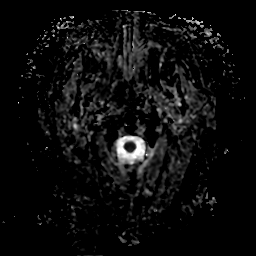
[im 25/49]
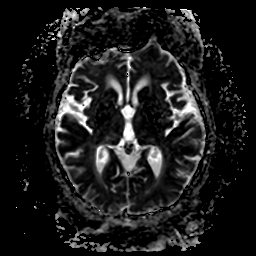
[im 49/49]
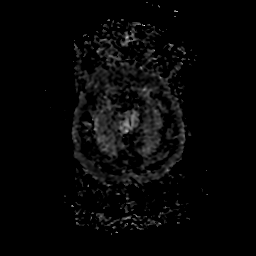

[Series 350: ADC · coronal · 4.0mm · 0.94mm/px · 2 of 35 slices shown (2 of 2)]
[im 1/35]
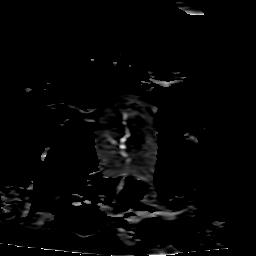
[im 35/35]
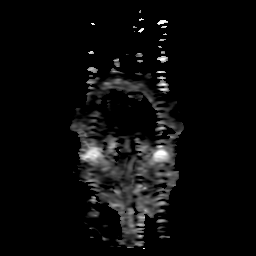

[23 of 48 positions shown; findings below may reference images not displayed]

FINDINGS: Brain: There is no acute infarction or intracranial hemorrhage.
There is no intracranial mass, mass effect, or edema. There is no
hydrocephalus or extra-axial fluid collection.

Loss prominence of the ventricles and sulci reflects generalized
parenchymal volume loss. Patchy and confluent areas of T2
hyperintensity in the supratentorial greater than pontine white
matter are nonspecific but probably reflect moderate chronic
microvascular ischemic changes. There are very small chronic
bilateral cerebellar infarcts.

No abnormal parenchymal enhancement. Prominent cortical enhancement
at the vertex appears venous rather than leptomeningeal.

Vascular: Major vessel flow voids at the skull base are preserved.

Skull and upper cervical spine: Normal marrow signal is preserved.

Sinuses/Orbits: Paranasal sinuses are aerated. Orbits are
unremarkable.

Other: Sella is unremarkable.  Mastoid air cells are clear.
IMPRESSION: No evidence of recent infarction, hemorrhage, or mass. No abnormal
enhancement.

Moderate chronic microvascular ischemic changes. Small chronic
cerebellar infarcts.

## 2020-08-16 MED ORDER — LORAZEPAM 2 MG/ML IJ SOLN
0.5000 mg | Freq: Every day | INTRAMUSCULAR | Status: DC | PRN
  Administered 2020-08-16: 0.5 mg via INTRAVENOUS
  Filled 2020-08-16: qty 1

## 2020-08-16 MED ORDER — GADOBUTROL 1 MMOL/ML IV SOLN
7.5000 mL | Freq: Once | INTRAVENOUS | Status: AC | PRN
  Administered 2020-08-16: 7.5 mL via INTRAVENOUS

## 2020-08-16 NOTE — Evaluation (Signed)
Occupational Therapy Evaluation Patient Details Name: Carla White MRN: [id number] DOB: May 23, 1935 Today's Date: 08/16/2020    History of Present Illness 85 y.o. female presents to [facility] ED on 08/13/2020 with aphasia and bifrontal headache. CT and MRI negative. EEG suggestive of nonspecific cortical dysfunction in left frontotemporal region. Additionally there is evidence of mild diffuse encephalopathy, no seizures noted. No significant PMH noted.   Clinical Impression   PTA, pt was living at home alone, pt reports she was independent with ADL/IADL and functional mobility. 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 Pt currently demonstrates cognitive limitations and instability impacting her safety and independence with self-care and functional mobility. Pt demonstrates decreased safety awareness and decreased awareness of deficits. Pt scored 17/28 on the short blessed test, a score of 10 or more indicates cognitive impairment and warrants further assessment, please see cognition section for additional information. Due to decline in current level of function, pt would benefit from acute OT to address established goals to facilitate safe D/C to venue listed below. At this time, recommend SNF follow-up. Will continue to follow acutely.     Follow Up Recommendations  SNF;Supervision/Assistance - 24 hour    Equipment Recommendations  3 in 1 bedside commode    Recommendations for Other Services       Precautions / Restrictions Precautions Precautions: Fall Restrictions Weight Bearing Restrictions: No      Mobility Bed Mobility Overal bed mobility: Independent                  Transfers Overall transfer level: Needs assistance Equipment used: None Transfers: Sit to/from Stand Sit to Stand: Min assist         General transfer comment: minor loss of balance with initial standing, required support from therapist to correct. Pt then utilized RW, required  minA for transfers with RW for cues for safety and navigating around obstacles    Balance Overall balance assessment: Needs assistance Sitting-balance support: No upper extremity supported;Feet supported Sitting balance-Leahy Scale: Good     Standing balance support: During functional activity;Single extremity supported Standing balance-Leahy Scale: Poor Standing balance comment: minA at times to correct losses of balance                           ADL either performed or assessed with clinical judgement   ADL Overall ADL's : Needs assistance/impaired Eating/Feeding: Set up;Sitting   Grooming: Min guard;Standing   Upper Body Bathing: Set up;Sitting   Lower Body Bathing: Min guard;Sit to/from stand   Upper Body Dressing : Set up;Sitting   Lower Body Dressing: Min guard;Sit to/from stand   Toilet Transfer: Minimal assistance;RW;Ambulation Statistician Details (indicate cue type and reason): minA for safe navigation around obstacles and safe use of RW Toileting- Clothing Manipulation and Hygiene: Minimal assistance;Sit to/from stand Toileting - Clothing Manipulation Details (indicate cue type and reason): 1x loss of balance with initial stand     Functional mobility during ADLs: Minimal assistance;Rolling walker General ADL Comments: pt limited by cognition and instability     Vision Baseline Vision/History: Wears glasses Wears Glasses: At all times Patient Visual Report: No change from baseline Vision Assessment?: Vision impaired- to be further tested in functional context Additional Comments: did not appear to have limitations with reading, but bumping into objects on right, will further assess;formal assessment limited this date secondary to decreased attention span     Perception  Praxis      Pertinent Vitals/Pain Pain Assessment: No/denies pain     Hand Dominance Right   Extremity/Trunk Assessment Upper Extremity Assessment Upper Extremity  Assessment: Overall WFL for tasks assessed   Lower Extremity Assessment Lower Extremity Assessment: Overall WFL for tasks assessed   Cervical / Trunk Assessment Cervical / Trunk Assessment: Normal   Communication Communication Communication: Expressive difficulties;HOH   Cognition Arousal/Alertness: Awake/alert Behavior During Therapy: WFL for tasks assessed/performed Overall Cognitive Status: Impaired/Different from baseline Area of Impairment: Problem solving;Awareness;Safety/judgement;Memory                     Memory: Decreased short-term memory   Safety/Judgement: Decreased awareness of deficits;Decreased awareness of safety Awareness: Intellectual Problem Solving: Slow processing General Comments: pt with poor safety awareness and spatial awareness, pt bumping into bed on Right side with RW, pt completed the Short Blessed Test to evaluate concentration and memory. The SBT is sensitive to early cognitive changes associated with Alzheimer's disease. Scores in the impaired range indicate need for further assessment. Scores of 0-4 indicate normal cognition, 5-9 indicate questionable impairment, 10 or more indicates impairment consistent with dementia. Pt scored 17/28 warranting further assessment for cognitive limitations. Pt demonstrated difficulty with working memory, concentration, short term memory, awareness of time, and attention. Pt with decreased insight to deficits and decreased awarenes of implications for safety concerns despite therapist education. Pt required increased time for current year, but able to state correctly. She is impulsive at time (family reports this is baseline personality)   General Comments  vss of RA;pt's 2 sons present during session.    Exercises     Shoulder Instructions      Home Living Family/patient expects to be discharged to:: Private residence Living Arrangements: Alone Available Help at Discharge: Neighbor;Available  PRN/intermittently Type of Home: House Home Access: Stairs to enter [employer] of Steps: 2 Entrance Stairs-Rails: None Home Layout: One level;Laundry or work area in basement     [employer]: Producer, television/film/video: Electrical engineer: None          Prior Functioning/Environment Level of Independence: Independent        Comments: 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processor, works in garden        OT Problem List: Impaired balance (sitting and/or standing);Decreased safety awareness;Decreased knowledge of use of DME or AE;Decreased cognition      OT Treatment/Interventions: Self-care/ADL training;DME and/or AE instruction;Therapeutic activities;Cognitive remediation/compensation;Patient/family education;Balance training    OT Goals(Current goals can be found in the care plan section) Acute Rehab OT Goals Patient Stated Goal: to return to independent mobility and living OT Goal Formulation: With patient Time For Goal Achievement: 08/30/20 Potential to Achieve Goals: Good ADL Goals Pt Will Transfer to Toilet: with modified independence;ambulating Additional ADL Goal #1: Pt will complete multistep cognition task with <3 errors for safe engagement in IADL. Additional ADL Goal #2: Pt will demonstrate anticipatory awareness for safe engagement in environment.  OT Frequency: Min 2X/week   Barriers to D/C: Decreased caregiver support  pt does not have 24/7 assistance       Co-evaluation              AM-PAC OT "6 Clicks" Daily Activity     Outcome Measure Help from another person eating meals?: A Little Help from another person taking care of personal grooming?: A Little Help from another person toileting, which includes using toliet, bedpan, or urinal?: A Little Help from  another person bathing (including washing, rinsing, drying)?: A Little Help from another person to put on and taking off regular upper body clothing?: A Little Help from  another person to put on and taking off regular lower body clothing?: A Little 6 Click Score: 18   End of Session Equipment Utilized During Treatment: Gait belt;Rolling walker Nurse Communication: Mobility status  Activity Tolerance: Patient tolerated treatment well Patient left: in chair;with call bell/phone within reach;with chair alarm set;Other (comment) (with SLP in the room)  OT Visit Diagnosis: Other abnormalities of gait and mobility (R26.89);Other symptoms and signs involving cognitive function                Time: 6433-2951 OT Time Calculation (min): 24 min Charges:  OT General Charges $OT Visit: 1 Visit OT Evaluation $OT Eval Moderate Complexity: 1 Mod OT Treatments $Self Care/Home Management : 8-22 mins  Rosey Bath OTR/L Acute Rehabilitation Services Office: [phone]   Rebeca Alert 08/16/2020, 1:12 PM

## 2020-08-16 NOTE — Evaluation (Signed)
Speech Language Pathology Evaluation Patient Details Name: Carla White MRN: [id number] DOB: 1935-03-24 Today's Date: 08/16/2020 Time: 5027-7412 SLP Time Calculation (min) (ACUTE ONLY): 24 min  Problem List:  Patient Active Problem List   Diagnosis Date Noted   CVA (cerebral vascular accident) (HCC) 08/15/2020   Slurred speech 08/14/2020   Aphasia 08/14/2020   Headache 08/14/2020   Past Medical History: History reviewed. No pertinent past medical history. Past Surgical History: History reviewed. No pertinent surgical history. HPI:  Patient is a pleasant 85 year old female with no significant past medical history presenting with episodes of nausea, bifrontal headache, aphasia.  Head CT done, CT angiogram head and neck, CT perfusion study negative for any acute abnormalities.  Patient seen in consultation by neurology and due to concern for seizures patient loaded with IV Keppra and placed on IV Keppra.  MRI brain and MRV brain ordered which were also negative.   Assessment / Plan / Recommendation Clinical Impression  Cognitive-linguistic evaluation complete. Patient presenting with moderate deficits in the areas of awareness of deficits and how they impact function, safety awareness, short term memory, and reasoning. No aphasia noted based on formal testing however in conversation, patient does both lose train of thought as well as appear to have difficulty formulating thoughts, appearing more related to cognitive function than a definate language disorder. Patient was independent prior to this admission. Due to decline in function, patient would benefit from acute SLP services to faciliate safety with ADLs. REcommend SNF f/u.    SLP Assessment  SLP Recommendation/Assessment: Patient needs continued Speech Lanaguage Pathology Services SLP Visit Diagnosis: Cognitive communication deficit (R41.841)    Follow Up Recommendations  Skilled Nursing facility    Frequency and Duration min  2x/week  2 weeks      SLP Evaluation Cognition  Overall Cognitive Status: Impaired/Different from baseline Arousal/Alertness: Awake/alert Orientation Level: Oriented to person;Oriented to place;Oriented to time;Disoriented to situation Attention: Sustained Sustained Attention: Impaired Sustained Attention Impairment: Verbal complex (HOH may be impacting) Memory: Impaired Memory Impairment: Storage deficit;Retrieval deficit;Decreased recall of new information Awareness: Impaired Awareness Impairment: Intellectual impairment Problem Solving: Impaired Problem Solving Impairment: Verbal complex Executive Function: Reasoning Reasoning: Impaired Reasoning Impairment: Verbal complex Behaviors: Other (comment) (intermittent giggling) Safety/Judgment: Impaired       Comprehension  Auditory Comprehension Overall Auditory Comprehension: Appears within functional limits for tasks assessed Visual Recognition/Discrimination Discrimination: Within Function Limits Reading Comprehension Reading Status: Within funtional limits    Expression Expression Primary Mode of Expression: Verbal Verbal Expression Overall Verbal Expression: Appears within functional limits for tasks assessed Written Expression Dominant Hand: Right   Oral / Motor  Oral Motor/Sensory Function Overall Oral Motor/Sensory Function: Within functional limits Motor Speech Overall Motor Speech: Appears within functional limits for tasks assessed   GO                   Ferdinand Lango MA, CCC-SLP  Yovana Scogin Meryl 08/16/2020, 1:38 PM

## 2020-08-16 NOTE — Progress Notes (Signed)
Neurology Progress Notes  Subjective: - Patient reports feeling well. Daughter at bedside says she is having some intermittent WFD but unclear if this is a mild confusion vs discrete episodes. She took all of her own EEG leads off this morning, but prior to that was diffusely slow with superimposed focal slowing over the L frontotemporal region.   Exam: Vitals:   08/15/20 2325 08/16/20 0405  BP: (!) 107/93 115/68  Pulse: 64 64  Resp: 20 17  Temp: 98.3 F (36.8 C) 98.3 F (36.8 C)  SpO2: 96% 97%   Gen: In bed, NAD Resp: non-labored breathing, no acute distress Abd: soft, nt  Neuro: MS: alert, oriented x3, able to calculate change Speech: mild WFD, able to think of word she wants to say but some delay in doing so CN: PERRL, EOMI, VFF, sensation intact, face symmetric, hearing intact to voice Motor: 5/5 throughout Sensory: SILT DTR: 2+ and symmetric throughout   Impression: 85 yo woman admitted for transient episodes WFD. MRI brain noncontrast somewhat limited 2/2 motion artifact but no definitvie abnl. EEG does show focal slowing in the left frontotemporal region indicating focal dysfunction in the region that would control aphasia.   Recommendations: 1) Continue to monitor clinically, patient will not tolerate cEEG 2) MRI brain repeat with and without contrast attn L FT region 3) Continue LEV 500mg  bid  Will continue to follow  , MD [facility] [phone]  If 7pm- 7am, please page neurology on call as listed in AMION.

## 2020-08-16 NOTE — Plan of Care (Signed)
Neurology Plan of Care  Attempted to see patient this afternoon but she was down at MRI. No change to plan. Will f/u results in AM.  Bing Neighbors, MD [facility] [phone]  If 7pm- 7am, please page neurology on call as listed in AMION.

## 2020-08-16 NOTE — Progress Notes (Addendum)
PROGRESS NOTE    Carla White  PYK:[id number] DOB: 19-Jul-1935 DOA: 08/13/2020 PCP: Lupita Raider, MD    Chief Complaint  Patient presents with   Code Stroke    Brief Narrative Patient is a pleasant 85 year old female with no significant past medical history presenting with episodes of nausea, bifrontal headache, aphasia.  Head CT done, CT angiogram head and neck, CT perfusion study negative for any acute abnormalities.  Patient seen in consultation by neurology and due to concern for seizures patient loaded with IV Keppra and placed on IV Keppra.  MRI brain and MRV brain ordered.  EEG ordered.   Assessment & Plan:   Principal Problem:   Aphasia Active Problems:   Slurred speech   Headache   CVA (cerebral vascular accident) (HCC)   1 bifrontal headache with nausea and vomiting, aphasia/word finding difficulty -Questionable etiology.  Concern for possible seizures. -Patient with intermittent bouts of aphasia/word finding difficulty. -CT head, CT angio head and neck, CT perfusion study with no acute abnormalities. -MRI brain/MRV brain negative for any acute abnormalities. -Patient with no signs or symptoms of infection. -Initial EEG done with nonspecific cortical dysfunction in left frontotemporal region, evidence of mild diffuse encephalopathy, nonspecific etiology, no seizures or epileptiform discharges are seen throughout the recording.   -LTM EEG done however it was noted that patient removed EEG leads during EEG procedure however noted per neurology that there was diffuse slowing with superimposed focal slowing over the left frontotemporal region.   -Patient seen in consultation by neurology and due to concerns for seizures was loaded with IV Keppra and placed on IV Keppra twice daily. -Discontinued Zofran and placed on IV Compazine as needed. -Neurology recommending MRI brain with and without contrast with focus on left frontotemporal region. -As needed Ativan ordered  for MRI. -PT/OT/SLP. -Neurology following.  2.  Borderline blood pressure -Continue gentle hydration.  Follow.      DVT prophylaxis: SCDs Code Status: Full Family Communication: Updated patient. No family at bedside.   Disposition:   Status is: Inpatient    Dispo: The patient is from: Home              Anticipated d/c is to: CIR              Patient currently is not medically stable to d/c.   Difficult to place patient No       Consultants:  Neurology: Dr.Khaliqdina 08/15/2019  Procedures:  CT cerebral perfusion 08/13/2020 CT angiogram head and neck 08/13/2020 CT head 08/13/2020 MRI head 08/14/2020 MR venogram 08/14/2020 EEG 08/15/2020 LTM EEG 08/15/2020--patient noted to have removed leads prematurely per neurology. MRI brain with and without contrast pending 08/16/2020   Antimicrobials:  None   Subjective: Laying in bed.  Denies any chest pain, no shortness of breath, no abdominal pain, no nausea or vomiting.  Patient with less word finding difficulties during my interview this morning.   Objective: Vitals:   08/15/20 1431 08/15/20 1956 08/15/20 2325 08/16/20 0405  BP: 128/63 (!) 145/68 (!) 107/93 115/68  Pulse:  77 64 64  Resp:  (!) 23 20 17   Temp: 98.4 F (36.9 C) 98.4 F (36.9 C) 98.3 F (36.8 C) 98.3 F (36.8 C)  TempSrc: Oral Oral Oral Axillary  SpO2:  99% 96% 97%  Weight:      Height:        Intake/Output Summary (Last 24 hours) at 08/16/2020 0955 Last data filed at 08/16/2020 0542 Gross per 24 hour  Intake 93  ml  Output 1900 ml  Net -1807 ml    Filed Weights   08/13/20 2345 08/14/20 0311  Weight: 58.1 kg 74.1 kg    Examination:  General exam: : NAD Respiratory system: CTA B anterior lung fields.  No wheezes, no rhonchi.  Speaking in full sentences.  Normal respiratory effort. Cardiovascular system: Regular rate and rhythm no murmurs rubs or gallops.  No JVD.  No lower extremity edema.  Gastrointestinal system: Abdomen soft, nontender,  nondistended, positive bowel sounds.  No rebound.  No guarding. Central nervous system: Alert and oriented x3. No focal neurological deficits.  Moving extremities spontaneously. Extremities: Symmetric 5 x 5 power. Skin: No rashes, lesions or ulcers Psychiatry: Judgement and insight appear normal. Mood & affect appropriate.  Data Reviewed: I have personally reviewed following labs and imaging studies  CBC: Recent Labs  Lab 08/13/20 2306 08/13/20 2314 08/14/20 0618 08/15/20 0203  WBC 11.2*  --  9.2 8.3  NEUTROABS 8.7*  --   --   --   HGB 13.0 13.3 11.9* 12.1  HCT 38.4 39.0 34.3* 35.8*  MCV 95.5  --  96.6 97.0  PLT 352  --  337 305     Basic Metabolic Panel: Recent Labs  Lab 08/13/20 2306 08/13/20 2314 08/13/20 2320 08/14/20 0618 08/15/20 0203  NA 135 135  --  133* 136  K 4.3 4.3  --  4.1 4.3  CL 100 100  --  97* 104  CO2 25  --   --  27 26  GLUCOSE 146* 142*  --  107* 94  BUN 16 17  --  13 10  CREATININE 1.04* 0.90  --  1.03* 1.04*  CALCIUM 9.1  --   --  9.0 8.8*  MG  --   --  2.0  --  2.2     GFR: Estimated Creatinine Clearance: 43.1 mL/min (A) (by C-G formula based on SCr of 1.04 mg/dL (H)).  Liver Function Tests: Recent Labs  Lab 08/13/20 2306  AST 28  ALT 21  ALKPHOS 102  BILITOT 0.8  PROT 7.0  ALBUMIN 3.9     CBG: Recent Labs  Lab 08/13/20 2307  GLUCAP 138*      Recent Results (from the past 240 hour(s))  Resp Panel by RT-PCR (Flu A&B, Covid) Nasopharyngeal Swab     Status: None   Collection Time: 08/13/20 11:06 PM   Specimen: Nasopharyngeal Swab; Nasopharyngeal(NP) swabs in vial transport medium  Result Value Ref Range Status   SARS Coronavirus 2 by RT PCR NEGATIVE NEGATIVE Final    Comment: (NOTE) SARS-CoV-2 target nucleic acids are NOT DETECTED.  The SARS-CoV-2 RNA is generally detectable in upper respiratory specimens during the acute phase of infection. The lowest concentration of SARS-CoV-2 viral copies this assay can detect  is 138 copies/mL. A negative result does not preclude SARS-Cov-2 infection and should not be used as the sole basis for treatment or other patient management decisions. A negative result may occur with  improper specimen collection/handling, submission of specimen other than nasopharyngeal swab, presence of viral mutation(s) within the areas targeted by this assay, and inadequate number of viral copies(<138 copies/mL). A negative result must be combined with clinical observations, patient history, and epidemiological information. The expected result is Negative.  Fact Sheet for Patients:  BloggerCourse.com  Fact Sheet for Healthcare Providers:  SeriousBroker.it  This test is no t yet approved or cleared by the [place] FDA and  has been authorized for detection and/or diagnosis  of SARS-CoV-2 by FDA under an Emergency Use Authorization (EUA). This EUA will remain  in effect (meaning this test can be used) for the duration of the COVID-19 declaration under Section 564(b)(1) of the Act, 21 U.S.C.section 360bbb-3(b)(1), unless the authorization is terminated  or revoked sooner.       Influenza A by PCR NEGATIVE NEGATIVE Final   Influenza B by PCR NEGATIVE NEGATIVE Final    Comment: (NOTE) The Xpert Xpress SARS-CoV-2/FLU/RSV plus assay is intended as an aid in the diagnosis of influenza from Nasopharyngeal swab specimens and should not be used as a sole basis for treatment. Nasal washings and aspirates are unacceptable for Xpert Xpress SARS-CoV-2/FLU/RSV testing.  Fact Sheet for Patients: BloggerCourse.comhttps://www.fda.gov/media/152166/download  Fact Sheet for Healthcare Providers: SeriousBroker.ithttps://www.fda.gov/media/152162/download  This test is not yet approved or cleared by the [place] FDA and has been authorized for detection and/or diagnosis of SARS-CoV-2 by FDA under an Emergency Use Authorization (EUA). This EUA will remain in effect  (meaning this test can be used) for the duration of the COVID-19 declaration under Section 564(b)(1) of the Act, 21 U.S.C. section 360bbb-3(b)(1), unless the authorization is terminated or revoked.  Performed at [facility], [address] [place], [place] [address]           Radiology Studies: EEG adult  Result Date: 08/15/2020 Charlsie QuestYadav, Priyanka O, MD     08/15/2020  8:37 AM Patient Name: Carla White MRN: [id number] Epilepsy Attending: Charlsie QuestPriyanka O Yadav Referring Physician/Provider: Dr Bing Neighborsolleen Stack Date: 08/15/2020 Duration: 24.05 mins Patient history: 85 year old female who presented with episode of aphasia nausea and bifrontal headaches.  EEG to evaluate for seizures. Level of alertness: Awake AEDs during EEG study: Keppra Technical aspects: This EEG study was done with scalp electrodes positioned according to the 10-20 International system of electrode placement. Electrical activity was acquired at a sampling rate of 500Hz  and reviewed with a high frequency filter of 70Hz  and a low frequency filter of 1Hz . EEG data were recorded continuously and digitally stored. Description: The posterior dominant rhythm consists of 9-10 Hz activity of moderate voltage (25-35 uV) seen predominantly in posterior head regions, symmetric and reactive to eye opening and eye closing. EEG showed intermittent generalized and maximal left frontotemporal region 3 to 6 Hz theta-delta slowing. Hyperventilation and photic stimulation were not performed.   ABNORMALITY - Intermittent slow, generalized and maximal left frontotemporal region IMPRESSION: This study is suggestive of nonspecific cortical dysfunction in left frontotemporal region. Additionally there is evidence of mild diffuse encephalopathy, nonspecific etiology.No seizures or epileptiform discharges were seen throughout the recording. Priyanka Annabelle Harman Yadav   Overnight EEG with video  Result Date: 08/15/2020 Charlsie QuestYadav, Priyanka O, MD     08/15/2020 10:56 AM  Patient Name: Carla White MRN: [id number] Epilepsy Attending: Charlsie QuestPriyanka O Yadav Referring Physician/Provider: Dr Bing Neighborsolleen Stack Duration: 08/15/2020 0300 to 08/15/2020 0930  Patient history: 85 year old female who presented with episode of aphasia nausea and bifrontal headaches.  EEG to evaluate for seizures.  Level of alertness: Awake, asleep  AEDs during EEG study: Keppra  Technical aspects: This EEG study was done with scalp electrodes positioned according to the 10-20 International system of electrode placement. Electrical activity was acquired at a sampling rate of 500Hz  and reviewed with a high frequency filter of 70Hz  and a low frequency filter of 1Hz . EEG data were recorded continuously and digitally stored.  Description: The posterior dominant rhythm consists of 9-10 Hz activity of moderate voltage (25-35 uV) seen predominantly in posterior  head regions, symmetric and reactive to eye opening and eye closing.  Sleep was characterized by sleep spindles (12 to 14 Hz): No frontocentral region.  EEG showed intermittent generalized and maximal left frontotemporal region 3 to 6 Hz theta-delta slowing. Hyperventilation and photic stimulation were not performed.    ABNORMALITY - Intermittent slow, generalized and maximal left frontotemporal region  IMPRESSION: This study is suggestive of nonspecific cortical dysfunction in left frontotemporal region. Additionally there is evidence of mild diffuse encephalopathy, nonspecific etiology.No seizures or epileptiform discharges were seen throughout the recording.   Priyanka Annabelle Harman       Scheduled Meds: Continuous Infusions:  sodium chloride 100 mL/hr at 08/16/20 0534   levETIRAcetam 500 mg (08/16/20 0538)     LOS: 1 day    Time spent: 35 minutes     Ramiro Harvest, MD [facility]   To contact the attending provider between 7A-7P or the covering provider during after hours 7P-7A, please log into the web site [email or web address] and access using  universal [place] password for that web site. If you do not have the password, please call the hospital operator.  08/16/2020, 9:55 AM

## 2020-08-16 NOTE — Progress Notes (Signed)
Inpatient Rehab Admissions Coordinator Note:   CIR consult received. Pt was screened for CIR candidacy by Megan Salon, MS CCC-SLP. In the absence of any acute neurological findings, pt. Does not demonstrate medical necessity for CIR admit at this time. Please contact me with any questions.   Megan Salon, MS, CCC-SLP Rehab Admissions Coordinator  [phone] (celll) [phone] (office)

## 2020-08-17 DIAGNOSIS — G3184 Mild cognitive impairment, so stated: Secondary | ICD-10-CM

## 2020-08-17 LAB — BASIC METABOLIC PANEL
Anion gap: 7 (ref 5–15)
BUN: 10 mg/dL (ref 8–23)
CO2: 27 mmol/L (ref 22–32)
Calcium: 9 mg/dL (ref 8.9–10.3)
Chloride: 102 mmol/L (ref 98–111)
Creatinine, Ser: 0.97 mg/dL (ref 0.44–1.00)
GFR, Estimated: 58 mL/min — ABNORMAL LOW (ref >60)
Glucose, Bld: 104 mg/dL — ABNORMAL HIGH (ref 70–99)
Potassium: 3.9 mmol/L (ref 3.5–5.1)
Sodium: 136 mmol/L (ref 135–145)

## 2020-08-17 LAB — TSH: TSH: 1.23 u[IU]/mL (ref 0.350–4.500)

## 2020-08-17 LAB — VITAMIN B12: Vitamin B-12: 1453 pg/mL — ABNORMAL HIGH (ref 180–914)

## 2020-08-17 MED ORDER — ENOXAPARIN SODIUM 30 MG/0.3ML IJ SOSY
30.0000 mg | PREFILLED_SYRINGE | INTRAMUSCULAR | Status: DC
  Administered 2020-08-17: 30 mg via SUBCUTANEOUS
  Filled 2020-08-17: qty 0.3

## 2020-08-17 NOTE — [place] FL2 (Signed)
  [place] MEDICAID FL2 LEVEL OF CARE SCREENING TOOL     IDENTIFICATION  Patient Name: Carla White Birthdate: December 07, 1935 Sex: female Admission Date (Current Location): 08/13/2020  [facility] and [place] Number:  Producer, television/film/video and Address:  [facility] [place], [address] [place], [place] [address]      Provider Number: [id number]  Attending Physician Name and Address:  Rodolph Bong, MD  Relative Name and Phone Number:  Guerry Minors Daughter   [phone]    Current Level of Care: Hospital Recommended Level of Care: Skilled Nursing Facility Prior Approval Number:    Date Approved/Denied:   PASRR Number: [id number]  Discharge Plan: SNF    Current Diagnoses: Patient Active Problem List   Diagnosis Date Noted   CVA (cerebral vascular accident) (HCC) 08/15/2020   Slurred speech 08/14/2020   Aphasia 08/14/2020   Headache 08/14/2020    Orientation RESPIRATION BLADDER Height & Weight     Self, Place  Normal External catheter Weight: 163 lb 5.8 oz (74.1 kg) Height:  5\' 7"  (170.2 cm)  BEHAVIORAL SYMPTOMS/MOOD NEUROLOGICAL BOWEL NUTRITION STATUS      Continent Diet (see discharge summary)  AMBULATORY STATUS COMMUNICATION OF NEEDS Skin   Limited Assist Verbally Skin abrasions                       Personal Care Assistance Level of Assistance  Bathing, Feeding, Dressing Bathing Assistance: Independent Feeding assistance: Independent Dressing Assistance: Independent     Functional Limitations Info  Sight, Hearing, Speech Sight Info: Adequate Hearing Info: Adequate Speech Info: Impaired    SPECIAL CARE FACTORS FREQUENCY  PT (By licensed PT), OT (By licensed OT)     PT Frequency: 5x week OT Frequency: 5x week            Contractures Contractures Info: Not present    Additional Factors Info  Code Status, Allergies Code Status Info: full Allergies Info: Claritin-d 24 Hour (Loratadine-pseudoephedrine Er), Codeine,  Darvon (Propoxyphene), Fexofenadine, Loratadine, Penicillin G, Septra (Sulfamethoxazole-trimethoprim)           Current Medications (08/17/2020):  This is the current hospital active medication list Current Facility-Administered Medications  Medication Dose Route Frequency Provider Last Rate Last Admin   acetaminophen (TYLENOL) tablet 650 mg  650 mg Oral Q4H PRN 10/17/2020, MD   650 mg at 08/14/20 1731   enoxaparin (LOVENOX) injection 30 mg  30 mg Subcutaneous Q24H 10/14/20, MD   30 mg at 08/17/20 1239   levETIRAcetam (KEPPRA) IVPB 500 mg/100 mL premix  500 mg Intravenous Q12H 10/17/20, NP 400 mL/hr at 08/17/20 0535 500 mg at 08/17/20 0535   LORazepam (ATIVAN) injection 0.5 mg  0.5 mg Intravenous Daily PRN 10/17/20, MD   0.5 mg at 08/16/20 1520   oxyCODONE (Oxy IR/ROXICODONE) immediate release tablet 5 mg  5 mg Oral Q6H PRN 10/16/20, MD       prochlorperazine (COMPAZINE) injection 10 mg  10 mg Intravenous Q6H PRN Rodolph Bong, MD         Discharge Medications: Please see discharge summary for a list of discharge medications.  Relevant Imaging Results:  Relevant Lab Results:   Additional Information SSN Rodolph Bong  [id number], LCSW

## 2020-08-17 NOTE — Progress Notes (Signed)
PROGRESS NOTE    Carla White  KZL:[id number] DOB: [birth year] DOA: 08/13/2020 PCP: Lupita Raider, MD    Chief Complaint  Patient presents with   Code Stroke    Brief Narrative Patient is a pleasant 85 year old female with no significant past medical history presenting with episodes of nausea, bifrontal headache, aphasia.  Head CT done, CT angiogram head and neck, CT perfusion study negative for any acute abnormalities.  Patient seen in consultation by neurology and due to concern for seizures patient loaded with IV Keppra and placed on IV Keppra.  MRI brain and MRV brain ordered.  EEG ordered.   Assessment & Plan:   Principal Problem:   Aphasia Active Problems:   Slurred speech   Headache   CVA (cerebral vascular accident) (HCC)   1 bifrontal headache with nausea and vomiting, aphasia/word finding difficulty -?? etiology.  Concern for possible seizures. -Patient with intermittent bouts of aphasia/word finding difficulty. -CT head, CT angio head and neck, CT perfusion study with no acute abnormalities. -MRI brain/MRV brain negative for any acute abnormalities. -Patient with no signs or symptoms of infection. -Initial EEG done with nonspecific cortical dysfunction in left frontotemporal region, evidence of mild diffuse encephalopathy, nonspecific etiology, no seizures or epileptiform discharges are seen throughout the recording.   -LTM EEG done however it was noted that patient removed EEG leads during EEG procedure however noted per neurology that there was diffuse slowing with superimposed focal slowing over the left frontotemporal region.   -Patient seen in consultation by neurology and due to concerns for seizures was loaded with IV Keppra and placed on IV Keppra twice daily. -Discontinued Zofran and placed on IV Compazine as needed. -Neurology recommended MRI brain with and without contrast which was negative for any recent infarction, hemorrhage or mass, no abnormal  enhancement, moderate chronic microvascular ischemic changes.  Small chronic cerebellar infarcts noted. -Patient also seen by speech therapy who feel patient likely has some cognitive deficits as opposed to a definite language disorder.  -Vitamin B12 of 1453, TSH of 1.230.  RBC folate pending.  RPR pending.  -Patient seen by PT/OT/SLP.  -Neurology following.  2.  Borderline blood pressure -Improved with hydration.   -Saline lock IV fluids.      DVT prophylaxis: SCDs Code Status: Full Family Communication: Updated patient. No family at bedside.   Disposition:   Status is: Inpatient    Dispo: The patient is from: Home              Anticipated d/c is to: SNF              Patient currently is not medically stable to d/c.   Difficult to place patient No       Consultants:  Neurology: Dr.Khaliqdina 08/15/2019  Procedures:  CT cerebral perfusion 08/13/2020 CT angiogram head and neck 08/13/2020 CT head 08/13/2020 MRI head 08/14/2020 MR venogram 08/14/2020 EEG 08/15/2020 LTM EEG 08/15/2020--patient noted to have removed leads prematurely per neurology. MRI brain with and without contrast 08/16/2020   Antimicrobials:  None   Subjective: Sleeping but arousable.  Denies any chest pain.  No shortness of breath.  No abdominal pain.  No nausea or vomiting.  Asking for menu to order her lunch.   Denies any further aphasic episodes.    Objective: Vitals:   08/16/20 0405 08/16/20 1402 08/16/20 2035 08/17/20 0400  BP: 115/68 (!) 141/70 139/60 (!) 139/92  Pulse: 64 69 70 74  Resp: 17 18 (!) 23 17  Temp: 98.3  F (36.8 C) 98.5 F (36.9 C) 97.9 F (36.6 C) 98.1 F (36.7 C)  TempSrc: Axillary Oral Oral Oral  SpO2: 97% 98% 97% 98%  Weight:      Height:        Intake/Output Summary (Last 24 hours) at 08/17/2020 1449 Last data filed at 08/17/2020 0420 Gross per 24 hour  Intake --  Output 1750 ml  Net -1750 ml    Filed Weights   08/13/20 2345 08/14/20 0311  Weight: 58.1 kg 74.1 kg     Examination:  General exam: : NAD Respiratory system: CTA B anterior lung fields.  No wheezes, no rhonchi.  Speaking in full sentences.  Normal respiratory effort. Cardiovascular system: Regular rate and rhythm no murmurs rubs or gallops.  No JVD.  No lower extremity edema.  Gastrointestinal system: Abdomen soft, nontender, nondistended, positive bowel sounds.  No rebound.  No guarding. Central nervous system: Alert and oriented. No focal neurological deficits. Extremities: Symmetric 5 x 5 power. Skin: No rashes, lesions or ulcers Psychiatry: Judgement and insight appear normal. Mood & affect appropriate.  Data Reviewed: I have personally reviewed following labs and imaging studies  CBC: Recent Labs  Lab 08/13/20 2306 08/13/20 2314 08/14/20 0618 08/15/20 0203  WBC 11.2*  --  9.2 8.3  NEUTROABS 8.7*  --   --   --   HGB 13.0 13.3 11.9* 12.1  HCT 38.4 39.0 34.3* 35.8*  MCV 95.5  --  96.6 97.0  PLT 352  --  337 305     Basic Metabolic Panel: Recent Labs  Lab 08/13/20 2306 08/13/20 2314 08/13/20 2320 08/14/20 0618 08/15/20 0203 08/16/20 0916 08/17/20 1000  NA 135 135  --  133* 136 136 136  K 4.3 4.3  --  4.1 4.3 4.6 3.9  CL 100 100  --  97* 104 105 102  CO2 25  --   --  27 26 23 27   GLUCOSE 146* 142*  --  107* 94 92 104*  BUN 16 17  --  13 10 8 10   CREATININE 1.04* 0.90  --  1.03* 1.04* 1.02* 0.97  CALCIUM 9.1  --   --  9.0 8.8* 8.9 9.0  MG  --   --  2.0  --  2.2 2.0  --      GFR: Estimated Creatinine Clearance: 46.2 mL/min (by C-G formula based on SCr of 0.97 mg/dL).  Liver Function Tests: Recent Labs  Lab 08/13/20 2306  AST 28  ALT 21  ALKPHOS 102  BILITOT 0.8  PROT 7.0  ALBUMIN 3.9     CBG: Recent Labs  Lab 08/13/20 2307  GLUCAP 138*      Recent Results (from the past 240 hour(s))  Resp Panel by RT-PCR (Flu A&B, Covid) Nasopharyngeal Swab     Status: None   Collection Time: 08/13/20 11:06 PM   Specimen: Nasopharyngeal Swab;  Nasopharyngeal(NP) swabs in vial transport medium  Result Value Ref Range Status   SARS Coronavirus 2 by RT PCR NEGATIVE NEGATIVE Final    Comment: (NOTE) SARS-CoV-2 target nucleic acids are NOT DETECTED.  The SARS-CoV-2 RNA is generally detectable in upper respiratory specimens during the acute phase of infection. The lowest concentration of SARS-CoV-2 viral copies this assay can detect is 138 copies/mL. A negative result does not preclude SARS-Cov-2 infection and should not be used as the sole basis for treatment or other patient management decisions. A negative result may occur with  improper specimen collection/handling, submission of specimen other  than nasopharyngeal swab, presence of viral mutation(s) within the areas targeted by this assay, and inadequate number of viral copies(<138 copies/mL). A negative result must be combined with clinical observations, patient history, and epidemiological information. The expected result is Negative.  Fact Sheet for Patients:  BloggerCourse.com  Fact Sheet for Healthcare Providers:  SeriousBroker.it  This test is no t yet approved or cleared by the [place] FDA and  has been authorized for detection and/or diagnosis of SARS-CoV-2 by FDA under an Emergency Use Authorization (EUA). This EUA will remain  in effect (meaning this test can be used) for the duration of the COVID-19 declaration under Section 564(b)(1) of the Act, 21 U.S.C.section 360bbb-3(b)(1), unless the authorization is terminated  or revoked sooner.       Influenza A by PCR NEGATIVE NEGATIVE Final   Influenza B by PCR NEGATIVE NEGATIVE Final    Comment: (NOTE) The Xpert Xpress SARS-CoV-2/FLU/RSV plus assay is intended as an aid in the diagnosis of influenza from Nasopharyngeal swab specimens and should not be used as a sole basis for treatment. Nasal washings and aspirates are unacceptable for Xpert Xpress  SARS-CoV-2/FLU/RSV testing.  Fact Sheet for Patients: BloggerCourse.com  Fact Sheet for Healthcare Providers: SeriousBroker.it  This test is not yet approved or cleared by the [place] FDA and has been authorized for detection and/or diagnosis of SARS-CoV-2 by FDA under an Emergency Use Authorization (EUA). This EUA will remain in effect (meaning this test can be used) for the duration of the COVID-19 declaration under Section 564(b)(1) of the Act, 21 U.S.C. section 360bbb-3(b)(1), unless the authorization is terminated or revoked.  Performed at [facility], [address], [place], [place] [address]           Radiology Studies: MR BRAIN W WO CONTRAST  Result Date: 08/16/2020 CLINICAL DATA:  TIA, episodes of nausea, headache, aphasia EXAM: MRI HEAD WITHOUT AND WITH CONTRAST TECHNIQUE: Multiplanar, multiecho pulse sequences of the brain and surrounding structures were obtained without and with intravenous contrast. CONTRAST:  7.73mL GADAVIST GADOBUTROL 1 MMOL/ML IV SOLN COMPARISON:  08/14/2020 FINDINGS: Brain: There is no acute infarction or intracranial hemorrhage. There is no intracranial mass, mass effect, or edema. There is no hydrocephalus or extra-axial fluid collection. Loss prominence of the ventricles and sulci reflects generalized parenchymal volume loss. Patchy and confluent areas of T2 hyperintensity in the supratentorial greater than pontine white matter are nonspecific but probably reflect moderate chronic microvascular ischemic changes. There are very small chronic bilateral cerebellar infarcts. No abnormal parenchymal enhancement. Prominent cortical enhancement at the vertex appears venous rather than leptomeningeal. Vascular: Major vessel flow voids at the skull base are preserved. Skull and upper cervical spine: Normal marrow signal is preserved. Sinuses/Orbits: Paranasal sinuses are aerated. Orbits are  unremarkable. Other: Sella is unremarkable.  Mastoid air cells are clear. IMPRESSION: No evidence of recent infarction, hemorrhage, or mass. No abnormal enhancement. Moderate chronic microvascular ischemic changes. Small chronic cerebellar infarcts. Electronically Signed   By: Guadlupe Spanish M.D.   On: 08/16/2020 17:42        Scheduled Meds:  enoxaparin (LOVENOX) injection  30 mg Subcutaneous Q24H   Continuous Infusions:  levETIRAcetam 500 mg (08/17/20 0535)     LOS: 2 days    Time spent: 35 minutes     Ramiro Harvest, MD [facility]   To contact the attending provider between 7A-7P or the covering provider during after hours 7P-7A, please log into the web site [email or web address] and access using universal  [place] password for that web site. If you do not have the password, please call the hospital operator.  08/17/2020, 2:49 PM

## 2020-08-17 NOTE — TOC Progression Note (Addendum)
Transition of Care [facility]) - Progression Note    Patient Details  Name: Carla White MRN: [id number] Date of Birth: 1935/12/13  Transition of Care [facility]) CM/SW Contact  Lorri Frederick, LCSW Phone Number: 08/17/2020, 2:44 PM  Clinical Narrative:   Pt information sent out in hub for SNF. CSW spoke with pt and daughter in room, provided choice document.  Daughter interested in [facility] in particular.       Barriers to Discharge: Continued Medical Work up  Expected Discharge Plan and Services                                                 Social Determinants of Health (SDOH) Interventions    Readmission Risk Interventions No flowsheet data found.

## 2020-08-17 NOTE — TOC Initial Note (Addendum)
Transition of Care [facility]) - Initial/Assessment Note    Patient Details  Name: Carla White MRN: [id number] Date of Birth: Jul 01, 1935  Transition of Care [facility]) CM/SW Contact:    Lawerance Sabal, RN Phone Number: 08/17/2020, 1:58 PM  Clinical Narrative:          11:00 Spoke w patient at bedside.  She states that she prefers to go home at DC. She states that she has friend Carla White who will be helping her at DC.  She granted permission to speak with her daughter to set up DC plan. Will need to confirm supervision/ support at home and arrange [facility] services.  Attempted to reach daughter unsuccessful, LVM.  Carla White Daughter   [phone]          14:00 Spoke w daughter Carla White. She states that the plan for the patient's friend to help would not work, as she would not be able to physically care for her. Patient agreeable to SNF. Preferences are [facility], [place], and [address] in [place]. Agreeable to being sent in the HUB to [place] and [place] locations.     Barriers to Discharge: Continued Medical Work up   Patient Goals and CMS Choice        Expected Discharge Plan and Services                                                Prior Living Arrangements/Services                       Activities of Daily Living Home Assistive Devices/Equipment: None ADL Screening (condition at time of admission) Patient's cognitive ability adequate to safely complete daily activities?: Yes Is the patient deaf or have difficulty hearing?: No Does the patient have difficulty seeing, even when wearing glasses/contacts?: No Does the patient have difficulty concentrating, remembering, or making decisions?: No Patient able to express need for assistance with ADLs?: Yes Does the patient have difficulty dressing or bathing?: No Independently performs ADLs?: Yes (appropriate for developmental age) Does the patient have difficulty walking or climbing stairs?:  No Weakness of Legs: Both Weakness of Arms/Hands: None  Permission Sought/Granted                  Emotional Assessment              Admission diagnosis:  Slurred speech [R47.81] Word finding difficulty [R47.89] CVA (cerebral vascular accident) [facility]) [I63.9] Patient Active Problem List   Diagnosis Date Noted   CVA (cerebral vascular accident) (HCC) 08/15/2020   Slurred speech 08/14/2020   Aphasia 08/14/2020   Headache 08/14/2020   PCP:  Lupita Raider, MD Pharmacy:   [facility] [phone] - [place], [place] - [address] [place] [address] Phone: [phone] Fax: [phone]     Social Determinants of Health (SDOH) Interventions    Readmission Risk Interventions No flowsheet data found.

## 2020-08-17 NOTE — Progress Notes (Signed)
Neurology Progress Notes  Subjective: - NAEON - Pt reports feeling well, no new complaints today. Family not at bedside today.  Exam: Vitals:   08/17/20 1611 08/17/20 2052  BP: 136/68 (!) 142/66  Pulse: 76 87  Resp: 20 19  Temp: 98.4 F (36.9 C) 97.9 F (36.6 C)  SpO2: 97% 96%   Gen: In bed, NAD Resp: non-labored breathing, no acute distress Abd: soft, nt  Neuro: MS: alert, oriented x3 Speech: mild WFD, able to think of word she wants to say but some delay in doing so CN: PERRL, EOMI, VFF, sensation intact, face symmetric, hearing intact to voice Motor: 5/5 throughout Sensory: SILT DTR: 2+ and symmetric throughout  Workup this hospitalization: - cEEG 08/15/20: 9-10 hz BG, intermittent generalized and maximal left frontotemporal region 3 to 6 Hz theta-delta slowing, no IID no sz - MRI brain wwo 08/16/20: moderate atrophy but not out of proportion to age, moderate CSVID, small chronic bilat cerebellar infarcts - CT, CTA H&N, CTP showed no significant abnl other than moderate L A2 and R P2 stenosis - MRV neg - Infectious w/u unrevealing - B12 1453, TSH 1.230. Folate, RPR pending.  Impression: 85 yo woman admitted for transient episodes WFD with h/a and nausea.  I am still not convinced that these are discrete episodes of aphasia, like OT and SLP said I have only seen delayed processing and losing train of thought. I suspect that these cognitive issues are not as acute as patient and family are reporting. It does sound like there may have been some discrete spells of WFD prior to hospitalization and therefore I think it is worth continuing LEV 500mg  bid for now (at least until o/p neurology f/u) but I suspect this will end up being mild cognitive impairment with likely eventual progression to dementia but that will declare itself in time. It is interesting that the EEG showed intermittent slowing maximal at left frontotemporal region (the area that controls speech); would consider  repeat in 3 mos if spells persist   Recommendations: - No indication for further inpatient w/u at this time - I will arrange for outpatient neurology f/u - PT/OT/SLP  - Continue LEV 500mg  bid and discharge on that dose  Neurology to sign off, but please re-engage if additional neurologic concerns arise.  , MD [facility] [phone]  If 7pm- 7am, please page neurology on call as listed in AMION.

## 2020-08-17 NOTE — Progress Notes (Signed)
Physical Therapy Treatment Patient Details Name: Carla White MRN: [id number] DOB: 03/12/1935 Today's Date: 08/17/2020    History of Present Illness 85 y.o. female presents [facility] ED on 08/13/2020 with aphasia and bifrontal headache. CT and MRI negative. EEG suggestive of nonspecific cortical dysfunction in left frontotemporal region. Additionally there is evidence of mild diffuse encephalopathy, no seizures noted. No significant PMH noted.    PT Comments    Pt remains very unsteady with poor safety awareness, requiring physical assistance with transfers and gait. Per CIR AC note on 6/11, "pt does not demonstrate medical necessity for CIR admit at this time" and therefore d/c recommendations have been updated to SNF for further intensive therapy services for the pt prior to returning home alone. Pt would continue to benefit from skilled physical therapy services at this time while admitted and after d/c to address the below listed limitations in order to improve overall safety and independence with functional mobility.    Follow Up Recommendations  SNF     Equipment Recommendations  3in1 (PT)    Recommendations for Other Services       Precautions / Restrictions Precautions Precautions: Fall Restrictions Weight Bearing Restrictions: No    Mobility  Bed Mobility Overal bed mobility: Needs Assistance Bed Mobility: Supine to Sit;Sit to Supine     Supine to sit: Supervision Sit to supine: Supervision   General bed mobility comments: for safety; HOB flat    Transfers Overall transfer level: Needs assistance Equipment used: None;Rolling walker (2 wheeled) Transfers: Sit to/from Stand Sit to Stand: Min assist         General transfer comment: pt very usteady with transitional movement and very poor eccentric motor control when returning to a sitting position at EOB  Ambulation/Gait Ambulation/Gait assistance: Min assist;Mod assist Gait Distance (Feet): 200  Feet Assistive device: Rolling walker (2 wheeled) Gait Pattern/deviations: Decreased stride length;Drifts right/left Gait velocity: decreased   General Gait Details: pt with very poor coordination and instability, drifts right and left, bumping into objects/obstacles frequently bilaterally with seemingly no awareness; constant min A needed and frequent mod A needed for balance and support   Stairs             Wheelchair Mobility    Modified Rankin (Stroke Patients Only) Modified Rankin (Stroke Patients Only) Pre-Morbid Rankin Score: No symptoms Modified Rankin: Moderately severe disability     Balance Overall balance assessment: Needs assistance Sitting-balance support: No upper extremity supported;Feet supported Sitting balance-Leahy Scale: Fair     Standing balance support: During functional activity;No upper extremity supported;Bilateral upper extremity supported;Single extremity supported Standing balance-Leahy Scale: Poor Standing balance comment: requires min-mod A with and without UE supports                            Cognition Arousal/Alertness: Awake/alert Behavior During Therapy: WFL for tasks assessed/performed Overall Cognitive Status: Impaired/Different from baseline Area of Impairment: Problem solving;Awareness;Safety/judgement;Memory                     Memory: Decreased short-term memory   Safety/Judgement: Decreased awareness of deficits;Decreased awareness of safety Awareness: Intellectual Problem Solving: Slow processing        Exercises      General Comments        Pertinent Vitals/Pain Pain Assessment: No/denies pain    Home Living  Prior Function            PT Goals (current goals can now be found in the care plan section) Acute Rehab PT Goals PT Goal Formulation: With patient Time For Goal Achievement: 08/29/20 Potential to Achieve Goals: Good Progress towards PT goals:  Progressing toward goals    Frequency    Min 3X/week      PT Plan Discharge plan needs to be updated    Co-evaluation              AM-PAC PT "6 Clicks" Mobility   Outcome Measure  Help needed turning from your back to your side while in a flat bed without using bedrails?: None Help needed moving from lying on your back to sitting on the side of a flat bed without using bedrails?: None Help needed moving to and from a bed to a chair (including a wheelchair)?: A Little Help needed standing up from a chair using your arms (e.g., wheelchair or bedside chair)?: A Little Help needed to walk in hospital room?: A Lot Help needed climbing 3-5 steps with a railing? : A Lot 6 Click Score: 18    End of Session Equipment Utilized During Treatment: Gait belt Activity Tolerance: Patient tolerated treatment well Patient left: in bed;with call bell/phone within reach;with bed alarm set Nurse Communication: Mobility status PT Visit Diagnosis: Unsteadiness on feet (R26.81);Other abnormalities of gait and mobility (R26.89)     Time: 8295-6213 PT Time Calculation (min) (ACUTE ONLY): 15 min  Charges:  $Gait Training: 8-22 mins                     Arletta Bale, DPT  Acute Rehabilitation Services Pager [phone] Office [phone]    Carla White 08/17/2020, 11:39 AM

## 2020-08-18 ENCOUNTER — Other Ambulatory Visit: Payer: Self-pay

## 2020-08-18 DIAGNOSIS — R9401 Abnormal electroencephalogram [EEG]: Secondary | ICD-10-CM

## 2020-08-18 DIAGNOSIS — R4689 Other symptoms and signs involving appearance and behavior: Secondary | ICD-10-CM

## 2020-08-18 LAB — SARS CORONAVIRUS 2 (TAT 6-24 HRS): SARS Coronavirus 2: NEGATIVE

## 2020-08-18 LAB — RPR: RPR Ser Ql: NONREACTIVE

## 2020-08-18 MED ORDER — ENOXAPARIN SODIUM 40 MG/0.4ML IJ SOSY
40.0000 mg | PREFILLED_SYRINGE | INTRAMUSCULAR | Status: DC
  Administered 2020-08-18 – 2020-08-19 (×2): 40 mg via SUBCUTANEOUS
  Filled 2020-08-18 (×2): qty 0.4

## 2020-08-18 MED ORDER — LEVETIRACETAM 500 MG PO TABS
500.0000 mg | ORAL_TABLET | Freq: Two times a day (BID) | ORAL | Status: DC
  Administered 2020-08-18 – 2020-08-20 (×4): 500 mg via ORAL
  Filled 2020-08-18 (×4): qty 1

## 2020-08-18 MED ORDER — LEVETIRACETAM 500 MG PO TABS: 500.0000 mg | ORAL_TABLET | Freq: Two times a day (BID) | ORAL | Status: DC

## 2020-08-18 NOTE — Progress Notes (Signed)
PROGRESS NOTE    Carla White  [id number] DOB: 12/13/1935 DOA: 08/13/2020 PCP: Lupita RaiderShaw, Kimberlee, MD    Chief Complaint  Patient presents with   Code Stroke    Brief Narrative Patient is a pleasant 85 year old female with no significant past medical history presenting with episodes of nausea, bifrontal headache, aphasia.  Head CT done, CT angiogram head and neck, CT perfusion study negative for any acute abnormalities.  Patient seen in consultation by neurology and due to concern for seizures patient loaded with IV Keppra and placed on IV Keppra.  MRI brain and MRV brain ordered.  EEG ordered.   Assessment & Plan:   Principal Problem:   Aphasia Active Problems:   Slurred speech   Headache   CVA (cerebral vascular accident) (HCC)   1 bifrontal headache with nausea and vomiting, aphasia/word finding difficulty -?? etiology.  Concern for possible seizures.  Likely secondary to cognitive impairment. -Patient with intermittent bouts of aphasia/word finding difficulty. -CT head, CT angio head and neck, CT perfusion study with no acute abnormalities. -MRI brain/MRV brain negative for any acute abnormalities. -Patient with no signs or symptoms of infection. -Initial EEG done with nonspecific cortical dysfunction in left frontotemporal region, evidence of mild diffuse encephalopathy, nonspecific etiology, no seizures or epileptiform discharges are seen throughout the recording.   -LTM EEG done however it was noted that patient removed EEG leads during EEG procedure however noted per neurology that there was diffuse slowing with superimposed focal slowing over the left frontotemporal region.   -Patient seen in consultation by neurology and due to concerns for seizures was loaded with IV Keppra and placed on IV Keppra twice daily. -Discontinued Zofran and placed on IV Compazine as needed. -Neurology recommended MRI brain with and without contrast which was negative for any recent  infarction, hemorrhage or mass, no abnormal enhancement, moderate chronic microvascular ischemic changes.  Small chronic cerebellar infarcts noted. -Patient also seen by speech therapy who feel patient likely has some cognitive deficits as opposed to a definite language disorder.  -Vitamin B12 of 1453, TSH of 1.230.  RBC folate pending.  RPR nonreactive.   -Change IV Keppra to oral Keppra.   -Neurology following and recommending outpatient follow-up with neurology for further evaluation and to continue Keppra until follow-up with neurology in the outpatient setting.   -PT/OT/SLP has assessed patient.   -We will need SNF placement on discharge.   2.  Borderline blood pressure -Improved with hydration.   -IV fluids saline lock.      DVT prophylaxis: SCDs Code Status: Full Family Communication: Updated patient. No family at bedside.   Disposition:   Status is: Inpatient    Dispo: The patient is from: Home              Anticipated d/c is to: SNF in the next 24 hours for              Patient currently is not medically stable to d/c.   Difficult to place patient No       Consultants:  Neurology: Dr.Khaliqdina 08/15/2019  Procedures:  CT cerebral perfusion 08/13/2020 CT angiogram head and neck 08/13/2020 CT head 08/13/2020 MRI head 08/14/2020 MR venogram 08/14/2020 EEG 08/15/2020 LTM EEG 08/15/2020--patient noted to have removed leads prematurely per neurology. MRI brain with and without contrast 08/16/2020   Antimicrobials:  None   Subjective: Sitting up in recliner.  Denies any chest pain.  No shortness of breath.  No nausea or vomiting.  Overall feeling better.  Denies any further word finding difficulties.    Objective: Vitals:   08/17/20 1611 08/17/20 2052 08/17/20 2358 08/18/20 0429  BP: 136/68 (!) 142/66 117/62 (!) 121/52  Pulse: 76 87 82 69  Resp: 20 19 17 17   Temp: 98.4 F (36.9 C) 97.9 F (36.6 C) 98.4 F (36.9 C) 97.9 F (36.6 C)  TempSrc: Oral Oral Oral Oral   SpO2: 97% 96% 95% 95%  Weight:      Height:        Intake/Output Summary (Last 24 hours) at 08/18/2020 1143 Last data filed at 08/18/2020 0700 Gross per 24 hour  Intake 120 ml  Output 1150 ml  Net -1030 ml    Filed Weights   08/13/20 2345 08/14/20 0311  Weight: 58.1 kg 74.1 kg    Examination: General exam: : NAD Respiratory system: CTA B anterior lung fields.  No wheezes, no rhonchi.  Speaking in full sentences.  Normal respiratory effort. Cardiovascular system: Regular rate and rhythm no murmurs rubs or gallops.  No JVD.  No lower extremity edema.  Gastrointestinal system: Abdomen soft, nontender, nondistended, positive bowel sounds.  No rebound.  No guarding. Central nervous system: Alert and oriented. No focal neurological deficits. Extremities: Symmetric 5 x 5 power. Skin: No rashes, lesions or ulcers Psychiatry: Judgement and insight appear normal. Mood & affect appropriate.  Data Reviewed: I have personally reviewed following labs and imaging studies  CBC: Recent Labs  Lab 08/13/20 2306 08/13/20 2314 08/14/20 0618 08/15/20 0203  WBC 11.2*  --  9.2 8.3  NEUTROABS 8.7*  --   --   --   HGB 13.0 13.3 11.9* 12.1  HCT 38.4 39.0 34.3* 35.8*  MCV 95.5  --  96.6 97.0  PLT 352  --  337 305     Basic Metabolic Panel: Recent Labs  Lab 08/13/20 2306 08/13/20 2314 08/13/20 2320 08/14/20 0618 08/15/20 0203 08/16/20 0916 08/17/20 1000  NA 135 135  --  133* 136 136 136  K 4.3 4.3  --  4.1 4.3 4.6 3.9  CL 100 100  --  97* 104 105 102  CO2 25  --   --  27 26 23 27   GLUCOSE 146* 142*  --  107* 94 92 104*  BUN 16 17  --  13 10 8 10   CREATININE 1.04* 0.90  --  1.03* 1.04* 1.02* 0.97  CALCIUM 9.1  --   --  9.0 8.8* 8.9 9.0  MG  --   --  2.0  --  2.2 2.0  --      GFR: Estimated Creatinine Clearance: 46.2 mL/min (by C-G formula based on SCr of 0.97 mg/dL).  Liver Function Tests: Recent Labs  Lab 08/13/20 2306  AST 28  ALT 21  ALKPHOS 102  BILITOT 0.8  PROT  7.0  ALBUMIN 3.9     CBG: Recent Labs  Lab 08/13/20 2307  GLUCAP 138*      Recent Results (from the past 240 hour(s))  Resp Panel by RT-PCR (Flu A&B, Covid) Nasopharyngeal Swab     Status: None   Collection Time: 08/13/20 11:06 PM   Specimen: Nasopharyngeal Swab; Nasopharyngeal(NP) swabs in vial transport medium  Result Value Ref Range Status   SARS Coronavirus 2 by RT PCR NEGATIVE NEGATIVE Final    Comment: (NOTE) SARS-CoV-2 target nucleic acids are NOT DETECTED.  The SARS-CoV-2 RNA is generally detectable in upper respiratory specimens during the acute phase of infection. The lowest concentration of SARS-CoV-2 viral copies this  assay can detect is 138 copies/mL. A negative result does not preclude SARS-Cov-2 infection and should not be used as the sole basis for treatment or other patient management decisions. A negative result may occur with  improper specimen collection/handling, submission of specimen other than nasopharyngeal swab, presence of viral mutation(s) within the areas targeted by this assay, and inadequate number of viral copies(<138 copies/mL). A negative result must be combined with clinical observations, patient history, and epidemiological information. The expected result is Negative.  Fact Sheet for Patients:  BloggerCourse.com  Fact Sheet for Healthcare Providers:  SeriousBroker.it  This test is no t yet approved or cleared by the [place] FDA and  has been authorized for detection and/or diagnosis of SARS-CoV-2 by FDA under an Emergency Use Authorization (EUA). This EUA will remain  in effect (meaning this test can be used) for the duration of the COVID-19 declaration under Section 564(b)(1) of the Act, 21 U.S.C.section 360bbb-3(b)(1), unless the authorization is terminated  or revoked sooner.       Influenza A by PCR NEGATIVE NEGATIVE Final   Influenza B by PCR NEGATIVE NEGATIVE Final     Comment: (NOTE) The Xpert Xpress SARS-CoV-2/FLU/RSV plus assay is intended as an aid in the diagnosis of influenza from Nasopharyngeal swab specimens and should not be used as a sole basis for treatment. Nasal washings and aspirates are unacceptable for Xpert Xpress SARS-CoV-2/FLU/RSV testing.  Fact Sheet for Patients: BloggerCourse.com  Fact Sheet for Healthcare Providers: SeriousBroker.it  This test is not yet approved or cleared by the [place] FDA and has been authorized for detection and/or diagnosis of SARS-CoV-2 by FDA under an Emergency Use Authorization (EUA). This EUA will remain in effect (meaning this test can be used) for the duration of the COVID-19 declaration under Section 564(b)(1) of the Act, 21 U.S.C. section 360bbb-3(b)(1), unless the authorization is terminated or revoked.  Performed at [facility], [address] [place], [place] [address]           Radiology Studies: MR BRAIN W WO CONTRAST  Result Date: 08/16/2020 CLINICAL DATA:  TIA, episodes of nausea, headache, aphasia EXAM: MRI HEAD WITHOUT AND WITH CONTRAST TECHNIQUE: Multiplanar, multiecho pulse sequences of the brain and surrounding structures were obtained without and with intravenous contrast. CONTRAST:  7.63mL GADAVIST GADOBUTROL 1 MMOL/ML IV SOLN COMPARISON:  08/14/2020 FINDINGS: Brain: There is no acute infarction or intracranial hemorrhage. There is no intracranial mass, mass effect, or edema. There is no hydrocephalus or extra-axial fluid collection. Loss prominence of the ventricles and sulci reflects generalized parenchymal volume loss. Patchy and confluent areas of T2 hyperintensity in the supratentorial greater than pontine white matter are nonspecific but probably reflect moderate chronic microvascular ischemic changes. There are very small chronic bilateral cerebellar infarcts. No abnormal parenchymal enhancement. Prominent  cortical enhancement at the vertex appears venous rather than leptomeningeal. Vascular: Major vessel flow voids at the skull base are preserved. Skull and upper cervical spine: Normal marrow signal is preserved. Sinuses/Orbits: Paranasal sinuses are aerated. Orbits are unremarkable. Other: Sella is unremarkable.  Mastoid air cells are clear. IMPRESSION: No evidence of recent infarction, hemorrhage, or mass. No abnormal enhancement. Moderate chronic microvascular ischemic changes. Small chronic cerebellar infarcts. Electronically Signed   By: Guadlupe Spanish M.D.   On: 08/16/2020 17:42        Scheduled Meds:  enoxaparin (LOVENOX) injection  40 mg Subcutaneous Q24H   levETIRAcetam  500 mg Oral BID   Continuous Infusions:     LOS:  3 days    Time spent: 35 minutes     Ramiro Harvest, MD [facility]   To contact the attending provider between 7A-7P or the covering provider during after hours 7P-7A, please log into the web site [email or web address] and access using universal [place] password for that web site. If you do not have the password, please call the hospital operator.  08/18/2020, 11:43 AM

## 2020-08-18 NOTE — TOC Progression Note (Addendum)
Transition of Care [facility]) - Progression Note    Patient Details  Name: Carla White MRN: [id number] Date of Birth: 05-Sep-1935  [facility] CM/SW Contact  Mearl Latin, LCSW Phone Number: 08/18/2020, 11:08 AM  Clinical Narrative:    [facility] able to accept patient pending insurance approval. CSW submitted info to [facility] for review.   CSW provided update to patient's daughter.    Expected Discharge Plan: Skilled Nursing Facility Barriers to Discharge: Insurance Authorization  Expected Discharge Plan and Services Expected Discharge Plan: Skilled Nursing Facility In-house Referral: Clinical Social Work   Post Acute Care Choice: Skilled Nursing Facility Living arrangements for the past 2 months: Single Family Home                                       Social Determinants of Health (SDOH) Interventions    Readmission Risk Interventions No flowsheet data found.

## 2020-08-19 MED ORDER — THIAMINE HCL 100 MG/ML IJ SOLN
500.0000 mg | Freq: Every day | INTRAVENOUS | Status: DC
  Administered 2020-08-20 (×2): 500 mg via INTRAVENOUS
  Filled 2020-08-19 (×3): qty 5

## 2020-08-19 NOTE — TOC Progression Note (Addendum)
Transition of Care [facility]) - Progression Note    Patient Details  Name: Carla White MRN: [id number] Date of Birth: 15-Aug-1935  [facility]) CM/SW Contact  Huston Foley Jacklynn Ganong, RN Phone Number: 08/19/2020, 1:39 PM  Clinical Narrative:   Case manager spoke with patient's daughter, Pearlie Oyster [phone], concerning discharge plan. Patient does not qualify for SNF,was declined. Discussed need for Home Health services. Choice for Home Health agencies discussed, referral called to Lorenza Chick, [facility] Liaison. DME has been requested. CM will also provide Lafonda Mosses with Private Duty List, she says her mom lives alone, cant stay with her because she has 13 steps. Patient may be able to stay with a friend at night, Lafonda Mosses will be checking to confirm.     Expected Discharge Plan: Home w Home Health Services Barriers to Discharge: No Barriers Identified  Expected Discharge Plan and Services Expected Discharge Plan: Home w Home Health Services In-house Referral: Clinical Social Work Discharge Planning Services: CM Consult Post Acute Care Choice: Home Health, Durable Medical Equipment Living arrangements for the past 2 months: Single Family Home                 DME Arranged: 3-N-1, Walker rolling DME Agency: [employer] Date DME Agency Contacted: 08/19/20 Time DME Agency Contacted: [phone] Representative spoke with at DME Agency: Velna Hatchet HH Arranged: PT, OT, Nurse's Aide HH Agency: [facility] Date [facility] Agency Contacted: 08/19/20 Time HH Agency Contacted: [phone] Representative spoke with at [facility] Agency: Kandee Keen   Social Determinants of Health (SDOH) Interventions    Readmission Risk Interventions No flowsheet data found.

## 2020-08-19 NOTE — Progress Notes (Addendum)
PROGRESS NOTE    Carla White  MHW:[id number] DOB: Jul 26, 1935 DOA: 08/13/2020 PCP: Lupita Raider, MD    Chief Complaint  Patient presents with   Code Stroke    Brief Narrative Patient is a pleasant 85 year old female with no significant past medical history presenting with episodes of nausea, bifrontal headache, aphasia.  Head CT done, CT angiogram head and neck, CT perfusion study negative for any acute abnormalities.  Patient seen in consultation by neurology and due to concern for seizures patient loaded with IV Keppra and placed on IV Keppra.  MRI brain and MRV brain ordered.  EEG ordered.   Assessment & Plan:   Principal Problem:   Aphasia Active Problems:   Slurred speech   Headache   CVA (cerebral vascular accident) (HCC)   1 bifrontal headache with nausea and vomiting, aphasia/word finding difficulty -?? etiology.  Concern for possible seizures.  Likely secondary to cognitive impairment. -Patient with intermittent bouts of aphasia/word finding difficulty. -CT head, CT angio head and neck, CT perfusion study with no acute abnormalities. -MRI brain/MRV brain negative for any acute abnormalities. -Patient with no signs or symptoms of infection. -Initial EEG done with nonspecific cortical dysfunction in left frontotemporal region, evidence of mild diffuse encephalopathy, nonspecific etiology, no seizures or epileptiform discharges are seen throughout the recording.   -LTM EEG done however it was noted that patient removed EEG leads during EEG procedure however noted per neurology that there was diffuse slowing with superimposed focal slowing over the left frontotemporal region.   -Patient seen in consultation by neurology and due to concerns for seizures was loaded with IV Keppra and placed on IV Keppra twice daily. -Discontinued Zofran and placed on IV Compazine as needed. -Neurology recommended MRI brain with and without contrast which was negative for any recent  infarction, hemorrhage or mass, no abnormal enhancement, moderate chronic microvascular ischemic changes.  Small chronic cerebellar infarcts noted. -Patient also seen by speech therapy who feel patient likely has some cognitive deficits as opposed to a definite language disorder.  -Vitamin B12 of 1453, TSH of 1.230.  RBC folate pending.  RPR nonreactive.  -Check vitamin B1 levels. -Placed on vitamin B1 500 mg IV daily x3 doses. -Continue oral Keppra twice daily.   -Neurology following and recommending outpatient follow-up with neurology for further evaluation and to continue Keppra until follow-up with neurology in the outpatient setting.   -PT/OT/SLP has assessed patient.   -We will need SNF placement on discharge, per PT.  2.  Borderline blood pressure -Improved with hydration.   -IV fluids saline lock.  3.  Debility -Patient assessed by physical therapy who are recommending SNF. -Given uncertainty PT recommending additional assessment to make sure patient progresses. -SNF denied per insurance. -Likely home with home health if patient continues to progress with physical therapy.    DVT prophylaxis: SCDs Code Status: Full Family Communication: Updated patient. Updated daughter via telephone.   Disposition:   Status is: Inpatient    Dispo: The patient is from: Home              Anticipated d/c is to: Home with home health hopefully in the next 24 hours.  (SNF denied per insurance)              Patient currently is not medically stable to d/c.   Difficult to place patient No       Consultants:  Neurology: Dr.Khaliqdina 08/15/2019  Procedures:  CT cerebral perfusion 08/13/2020 CT angiogram head and neck 08/13/2020  CT head 08/13/2020 MRI head 08/14/2020 MR venogram 08/14/2020 EEG 08/15/2020 LTM EEG 08/15/2020--patient noted to have removed leads prematurely per neurology. MRI brain with and without contrast 08/16/2020   Antimicrobials:  None   Subjective: Laying in bed.   Feels better.  No chest pain.  No shortness of breath.  No nausea or vomiting.  Overall feeling better.  Word finding difficulties improving.   Objective: Vitals:   08/18/20 1349 08/18/20 2017 08/19/20 0523 08/19/20 1219  BP: 120/65 135/76 137/76 (!) 115/54  Pulse: (!) 107 99 84 91  Resp: 20 20 18 17   Temp: 97.8 F (36.6 C) 98.1 F (36.7 C) 98.3 F (36.8 C) 97.7 F (36.5 C)  TempSrc: Oral Oral Oral Oral  SpO2: 99% 100% 97% 96%  Weight:      Height:        Intake/Output Summary (Last 24 hours) at 08/19/2020 1246 Last data filed at 08/19/2020 1203 Gross per 24 hour  Intake 720 ml  Output --  Net 720 ml    Filed Weights   08/13/20 2345 08/14/20 0311  Weight: 58.1 kg 74.1 kg    Examination: General exam: : NAD Respiratory system: CTA B anterior lung fields.  No wheezes, no rhonchi.  Speaking in full sentences.  Normal respiratory effort. Cardiovascular system: Regular rate and rhythm no murmurs rubs or gallops.  No JVD.  No lower extremity edema.  Gastrointestinal system: Abdomen soft, nontender, nondistended, positive bowel sounds.  No rebound.  No guarding. Central nervous system: Alert and oriented. No focal neurological deficits. Extremities: Symmetric 5 x 5 power. Skin: No rashes, lesions or ulcers Psychiatry: Judgement and insight appear normal. Mood & affect appropriate.  Data Reviewed: I have personally reviewed following labs and imaging studies  CBC: Recent Labs  Lab 08/13/20 2306 08/13/20 2314 08/14/20 0618 08/15/20 0203  WBC 11.2*  --  9.2 8.3  NEUTROABS 8.7*  --   --   --   HGB 13.0 13.3 11.9* 12.1  HCT 38.4 39.0 34.3* 35.8*  MCV 95.5  --  96.6 97.0  PLT 352  --  337 305     Basic Metabolic Panel: Recent Labs  Lab 08/13/20 2306 08/13/20 2314 08/13/20 2320 08/14/20 0618 08/15/20 0203 08/16/20 0916 08/17/20 1000  NA 135 135  --  133* 136 136 136  K 4.3 4.3  --  4.1 4.3 4.6 3.9  CL 100 100  --  97* 104 105 102  CO2 25  --   --  27 26 23 27    GLUCOSE 146* 142*  --  107* 94 92 104*  BUN 16 17  --  13 10 8 10   CREATININE 1.04* 0.90  --  1.03* 1.04* 1.02* 0.97  CALCIUM 9.1  --   --  9.0 8.8* 8.9 9.0  MG  --   --  2.0  --  2.2 2.0  --      GFR: Estimated Creatinine Clearance: 46.2 mL/min (by C-G formula based on SCr of 0.97 mg/dL).  Liver Function Tests: Recent Labs  Lab 08/13/20 2306  AST 28  ALT 21  ALKPHOS 102  BILITOT 0.8  PROT 7.0  ALBUMIN 3.9     CBG: Recent Labs  Lab 08/13/20 2307  GLUCAP 138*      Recent Results (from the past 240 hour(s))  Resp Panel by RT-PCR (Flu A&B, Covid) Nasopharyngeal Swab     Status: None   Collection Time: 08/13/20 11:06 PM   Specimen: Nasopharyngeal Swab; Nasopharyngeal(NP) swabs  in vial transport medium  Result Value Ref Range Status   SARS Coronavirus 2 by RT PCR NEGATIVE NEGATIVE Final    Comment: (NOTE) SARS-CoV-2 target nucleic acids are NOT DETECTED.  The SARS-CoV-2 RNA is generally detectable in upper respiratory specimens during the acute phase of infection. The lowest concentration of SARS-CoV-2 viral copies this assay can detect is 138 copies/mL. A negative result does not preclude SARS-Cov-2 infection and should not be used as the sole basis for treatment or other patient management decisions. A negative result may occur with  improper specimen collection/handling, submission of specimen other than nasopharyngeal swab, presence of viral mutation(s) within the areas targeted by this assay, and inadequate number of viral copies(<138 copies/mL). A negative result must be combined with clinical observations, patient history, and epidemiological information. The expected result is Negative.  Fact Sheet for Patients:  BloggerCourse.com  Fact Sheet for Healthcare Providers:  SeriousBroker.it  This test is no t yet approved or cleared by the [place] FDA and  has been authorized for detection and/or  diagnosis of SARS-CoV-2 by FDA under an Emergency Use Authorization (EUA). This EUA will remain  in effect (meaning this test can be used) for the duration of the COVID-19 declaration under Section 564(b)(1) of the Act, 21 U.S.C.section 360bbb-3(b)(1), unless the authorization is terminated  or revoked sooner.       Influenza A by PCR NEGATIVE NEGATIVE Final   Influenza B by PCR NEGATIVE NEGATIVE Final    Comment: (NOTE) The Xpert Xpress SARS-CoV-2/FLU/RSV plus assay is intended as an aid in the diagnosis of influenza from Nasopharyngeal swab specimens and should not be used as a sole basis for treatment. Nasal washings and aspirates are unacceptable for Xpert Xpress SARS-CoV-2/FLU/RSV testing.  Fact Sheet for Patients: BloggerCourse.com  Fact Sheet for Healthcare Providers: SeriousBroker.it  This test is not yet approved or cleared by the [place] FDA and has been authorized for detection and/or diagnosis of SARS-CoV-2 by FDA under an Emergency Use Authorization (EUA). This EUA will remain in effect (meaning this test can be used) for the duration of the COVID-19 declaration under Section 564(b)(1) of the Act, 21 U.S.C. section 360bbb-3(b)(1), unless the authorization is terminated or revoked.  Performed at [facility], [address] [place], [place] [address]   SARS CORONAVIRUS 2 (TAT 6-24 HRS) Nasopharyngeal Nasopharyngeal Swab     Status: None   Collection Time: 08/18/20  9:20 AM   Specimen: Nasopharyngeal Swab  Result Value Ref Range Status   SARS Coronavirus 2 NEGATIVE NEGATIVE Final    Comment: (NOTE) SARS-CoV-2 target nucleic acids are NOT DETECTED.  The SARS-CoV-2 RNA is generally detectable in upper and lower respiratory specimens during the acute phase of infection. Negative results do not preclude SARS-CoV-2 infection, do not rule out co-infections with other pathogens, and should not be used as  the sole basis for treatment or other patient management decisions. Negative results must be combined with clinical observations, patient history, and epidemiological information. The expected result is Negative.  Fact Sheet for Patients: HairSlick.no  Fact Sheet for Healthcare Providers: quierodirigir.com  This test is not yet approved or cleared by the [place] FDA and  has been authorized for detection and/or diagnosis of SARS-CoV-2 by FDA under an Emergency Use Authorization (EUA). This EUA will remain  in effect (meaning this test can be used) for the duration of the COVID-19 declaration under Se ction 564(b)(1) of the Act, 21 U.S.C. section 360bbb-3(b)(1), unless the authorization is  terminated or revoked sooner.  Performed at [facility], [address] [place], [place] [address]           Radiology Studies: No results found.      Scheduled Meds:  enoxaparin (LOVENOX) injection  40 mg Subcutaneous Q24H   levETIRAcetam  500 mg Oral BID   Continuous Infusions:     LOS: 4 days    Time spent: 35 minutes     Ramiro Harvestaniel Keeton Kassebaum, MD [facility]   To contact the attending provider between 7A-7P or the covering provider during after hours 7P-7A, please log into the web site [email or web address] and access using universal [place] password for that web site. If you do not have the password, please call the hospital operator.  08/19/2020, 12:46 PM

## 2020-08-19 NOTE — TOC Progression Note (Signed)
Transition of Care [facility]) - Progression Note    Patient Details  Name: EMRIE GAYLE MRN: [id number] Date of Birth: 1935-03-24  [facility] of [facility]) CM/SW Contact  Erin Sons, [place] Phone Number: 08/19/2020, 1:55 PM  Clinical Narrative:      CSW received call from [facility] and is informed that pt does not meet med necessity for SNF. Peer to Peer is offered; MD would call Dr. Lovena Le at [phone] by 2pm if peer to peer is to be pursued.    Expected Discharge Plan: Home w Home Health Services Barriers to Discharge: No Barriers Identified  Expected Discharge Plan and Services Expected Discharge Plan: Home w Home Health Services In-house Referral: Clinical Social Work Discharge Planning Services: CM Consult Post Acute Care Choice: Home Health, Durable Medical Equipment Living arrangements for the past 2 months: Single Family Home                 DME Arranged: 3-N-1, Walker rolling DME Agency: [employer] Date DME Agency Contacted: 08/19/20 Time DME Agency Contacted: [phone] Representative spoke with at DME Agency: Velna Hatchet HH Arranged: PT, OT, Nurse's Aide HH Agency: [facility] Date [facility] Agency Contacted: 08/19/20 Time HH Agency Contacted: 1226 Representative spoke with at [facility] Agency: Kandee Keen   Social Determinants of Health (SDOH) Interventions    Readmission Risk Interventions No flowsheet data found.

## 2020-08-19 NOTE — Progress Notes (Signed)
Physical Therapy Treatment Patient Details Name: Carla White MRN: [id number] DOB: [birth year] Today's Date: 08/19/2020    History of Present Illness 85 y.o. female presents to [facility] ED on 08/13/2020 with aphasia and bifrontal headache. CT and MRI negative. EEG suggestive of nonspecific cortical dysfunction in left frontotemporal region. Additionally there is evidence of mild diffuse encephalopathy, no seizures noted. No significant PMH noted.    PT Comments    Patient received in bed sleeping but easily awakened. Did much better physically and cognitively today- able to mobilize on a min guard basis with RW and only min cues for safety/navigation, also with full scoring on 3 word recall and able to find her room in the hallway without cues. Does still have some decreased awareness of obstacles in hallway but much improved. Left up in recliner with all needs met, chair alarm active. Would like to see one more time before changing reccs to make sure she is consistent, but might be able to return home with 24/7A if she continues to make progress.    Follow Up Recommendations  SNF     Equipment Recommendations  3in1 (PT)    Recommendations for Other Services       Precautions / Restrictions Precautions Precautions: Fall Restrictions Weight Bearing Restrictions: No    Mobility  Bed Mobility Overal bed mobility: Needs Assistance Bed Mobility: Supine to Sit     Supine to sit: Modified independent (Device/Increase time)     General bed mobility comments: HOB flat    Transfers Overall transfer level: Needs assistance Equipment used: Rolling walker (2 wheeled) Transfers: Sit to/from Stand Sit to Stand: Min guard         General transfer comment: min guard to boost to full upright standing, no physical assist given  Ambulation/Gait Ambulation/Gait assistance: Min guard Gait Distance (Feet): 220 Feet Assistive device: Rolling walker (2 wheeled) Gait Pattern/deviations:  Step-through pattern;Drifts right/left     General Gait Details: much better coordination/stability today- did need Min cues to avoid obstacles in hallway but only min guard for balance. Tends to walk a bit faster than is really safe but question if this is also her baseline.   Stairs             Wheelchair Mobility    Modified Rankin (Stroke Patients Only)       Balance Overall balance assessment: Needs assistance Sitting-balance support: No upper extremity supported;Feet supported Sitting balance-Leahy Scale: Normal     Standing balance support: Bilateral upper extremity supported;During functional activity Standing balance-Leahy Scale: Good                              Cognition Arousal/Alertness: Awake/alert Behavior During Therapy: WFL for tasks assessed/performed Overall Cognitive Status: Impaired/Different from baseline Area of Impairment: Problem solving;Awareness;Safety/judgement                         Safety/Judgement: Decreased awareness of deficits;Decreased awareness of safety Awareness: Emergent Problem Solving: Requires verbal cues General Comments: much more cognitively alert and clear today- able to find her room number without cues in hallway, and scored 3/3 on word recall test. Did need Min VC for safety/to avoid obstacles in the hallway. Slightly impulsive but per prior charting this is baseline.      Exercises      General Comments        Pertinent Vitals/Pain Pain Assessment: No/denies pain  Home Living                      Prior Function            PT Goals (current goals can now be found in the care plan section) Acute Rehab PT Goals Patient Stated Goal: to return to independent mobility and living PT Goal Formulation: With patient Time For Goal Achievement: 08/29/20 Potential to Achieve Goals: Good Additional Goals Additional Goal #1: Pt will score >19/24 on DGI to indicate a reduced risk for  falls. Progress towards PT goals: Progressing toward goals    Frequency    Min 3X/week      PT Plan Current plan remains appropriate    Co-evaluation              AM-PAC PT "6 Clicks" Mobility   Outcome Measure  Help needed turning from your back to your side while in a flat bed without using bedrails?: None Help needed moving from lying on your back to sitting on the side of a flat bed without using bedrails?: None Help needed moving to and from a bed to a chair (including a wheelchair)?: None Help needed standing up from a chair using your arms (e.g., wheelchair or bedside chair)?: None Help needed to walk in hospital room?: A Little Help needed climbing 3-5 steps with a railing? : A Little 6 Click Score: 22    End of Session Equipment Utilized During Treatment: Gait belt Activity Tolerance: Patient tolerated treatment well Patient left: in chair;with call bell/phone within reach;with chair alarm set Nurse Communication: Mobility status PT Visit Diagnosis: Unsteadiness on feet (R26.81);Other abnormalities of gait and mobility (R26.89)     Time: 9476-5465 PT Time Calculation (min) (ACUTE ONLY): 14 min  Charges:  $Gait Training: 8-22 mins                    Windell Norfolk, DPT, PN1   Supplemental Physical Therapist [place]    Pager [phone] Acute Rehab Office [phone]

## 2020-08-19 NOTE — Progress Notes (Signed)
  Speech Language Pathology Treatment: Cognitive-Linquistic  Patient Details Name: Carla White MRN: [id number] DOB: [birth year] Today's Date: 08/19/2020 Time: 1120-1140 SLP Time Calculation (min) (ACUTE ONLY): 20 min  Assessment / Plan / Recommendation Clinical Impression  Pt seen for skilled cognitive treatment with focus on short term recall of steps to complete functional task. She demonstrated difficulty calling family member using personal cell phone and desired to call daughter. Provided 3 step sequence (phone icon, favorites tab, daughters name), pt recalled steps to initiate phone call in 2/5 trials. Unsuccessful trials required min verbal/gestural cueing to recall 1 of the 3 provided steps. 2/3 steps consistently recalled. During phone call with daughter, she inquired regarding pt status and discharge. This clinician referred daughter to contact RN who would be better suited to direct her and inform regarding this. Additionally, informed her that SLP services will continue to follow acutely for cognition. She expressed agreement with recommendations. Will continue to follow.    HPI HPI: Patient is a pleasant 85 year old female with no significant past medical history presenting with episodes of nausea, bifrontal headache, aphasia.  Head CT done, CT angiogram head and neck, CT perfusion study negative for any acute abnormalities.  Patient seen in consultation by neurology and due to concern for seizures patient loaded with IV Keppra and placed on IV Keppra.  MRI brain and MRV brain ordered which were also negative.      SLP Plan  Continue with current plan of care       Recommendations                   Follow up Recommendations: Skilled Nursing facility SLP Visit Diagnosis: Cognitive communication deficit (Y19.509) Plan: Continue with current plan of care       GO              Avie Echevaria, MA, CCC-SLP Acute Rehabilitation Services Office Number: [phone]   Paulette Blanch 08/19/2020, 11:51 AM

## 2020-08-19 NOTE — Progress Notes (Signed)
Occupational Therapy Treatment Patient Details Name: Carla White MRN: [id number] DOB: 12/15/1935 Today's Date: 08/19/2020    History of present illness 85 y.o. female presents to [facility] ED on 08/13/2020 with aphasia and bifrontal headache. CT and MRI negative. EEG suggestive of nonspecific cortical dysfunction in left frontotemporal region. Additionally there is evidence of mild diffuse encephalopathy, no seizures noted. No significant PMH noted.   OT comments  Pt making steady progress towards OT goals this session. Pt received alseep in bed but easily able to arouse and agreeable to OT intervention. Pt continues to present with cogntive impairments but overall needing only min guard assist for functional mobility with and without RW. Pt currenlty requires supervision for standing grooming tasks, and set- up - supervision for UB/ LB ADLs. Pt with difficulty multitasking and needed MOD cues to recall how to return to pts room from hallway ( see cog section). Noted pt does not qualify for SNF and plan is to return home with 24/7 support from family/friends per CM note, therefore updated below DC recs to include HHOT and recommend shower seat to decrease fall risk in tub shower. Pt would continue to benefit from skilled occupational therapy while admitted and after d/c to address the below listed limitations in order to improve overall functional mobility and facilitate independence with BADL participation. DC plan remains appropriate, will follow acutely per POC.     Follow Up Recommendations  SNF;Supervision/Assistance - 24 hour;Home health OT    Equipment Recommendations  3 in 1 bedside commode;Tub/shower seat    Recommendations for Other Services      Precautions / Restrictions Precautions Precautions: Fall Restrictions Weight Bearing Restrictions: No       Mobility Bed Mobility Overal bed mobility: Needs Assistance Bed Mobility: Supine to Sit;Sit to Supine     Supine to sit:  Modified independent (Device/Increase time) Sit to supine: Modified independent (Device/Increase time)   General bed mobility comments: no physical assist needed, use of bed features    Transfers Overall transfer level: Needs assistance Equipment used: Rolling walker (2 wheeled) Transfers: Sit to/from Stand Sit to Stand: Min guard;Supervision         General transfer comment: min guard to rise from EOB, progressing to supervision from toilet seat    Balance Overall balance assessment: Needs assistance Sitting-balance support: No upper extremity supported;Feet supported Sitting balance-Leahy Scale: Normal     Standing balance support: No upper extremity supported;During functional activity Standing balance-Leahy Scale: Fair Standing balance comment: completing ADLs at sink with no UE support                           ADL either performed or assessed with clinical judgement   ADL Overall ADL's : Needs assistance/impaired     Grooming: Wash/dry hands;Standing;Supervision/safety       Lower Body Bathing: Supervison/ safety;Sitting/lateral leans Lower Body Bathing Details (indicate cue type and reason): simulated via anterior pericare Upper Body Dressing : Set up;Sitting Upper Body Dressing Details (indicate cue type and reason): to don bathrobe Lower Body Dressing: Supervision/safety;Sitting/lateral leans Lower Body Dressing Details (indicate cue type and reason): pulling up socks from EOB Toilet Transfer: Min guard;Ambulation;Regular Teacher, adult education Details (indicate cue type and reason): minguard for safety, pt additionally able to complete simulated toilet transfer via functional mobility in hallway with Rw and min guard assist, intermittent cues needed to manage RW around obstacles Toileting- Clothing Manipulation and Hygiene: Supervision/safety;Sitting/lateral lean Toileting - Clothing Manipulation  Details (indicate cue type and reason): anterior  pericare   Tub/Shower Transfer Details (indicate cue type and reason): pt reports tub shower at home Functional mobility during ADLs: Min guard;Rolling walker General ADL Comments: pt most limiting factor appears to be cog     Vision       Perception     Praxis      Cognition Arousal/Alertness: Awake/alert Behavior During Therapy: WFL for tasks assessed/performed;Impulsive (mild impulsivity with mobility, maybe baseline?) Overall Cognitive Status: Impaired/Different from baseline Area of Impairment: Orientation;Safety/judgement;Awareness;Problem solving                 Orientation Level: Disoriented to;Time (only disoriented to day of week)       Safety/Judgement: Decreased awareness of deficits;Decreased awareness of safety Awareness: Intellectual Problem Solving: Requires verbal cues General Comments: pt cognition appears to be slowly improving from previous OT session however pt seems to have difficulty multitasking with pt able to name only 4 animals in 1 min and only able to state 3 month backwards during functional mobility in hallway, MOD cues needed to recall how to return to pts room from hallway        Exercises     Shoulder Instructions       General Comments      Pertinent Vitals/ Pain       Pain Assessment: No/denies pain  Home Living                                          Prior Functioning/Environment              Frequency  Min 2X/week        Progress Toward Goals  OT Goals(current goals can now be found in the care plan section)  Progress towards OT goals: Progressing toward goals  Acute Rehab OT Goals Patient Stated Goal: to go home OT Goal Formulation: With patient Time For Goal Achievement: 08/30/20 Potential to Achieve Goals: Good  Plan Discharge plan needs to be updated;Equipment recommendations need to be updated    Co-evaluation                 AM-PAC OT "6 Clicks" Daily Activity      Outcome Measure   Help from another person eating meals?: None Help from another person taking care of personal grooming?: None Help from another person toileting, which includes using toliet, bedpan, or urinal?: A Little Help from another person bathing (including washing, rinsing, drying)?: A Little Help from another person to put on and taking off regular upper body clothing?: None Help from another person to put on and taking off regular lower body clothing?: A Little 6 Click Score: 21    End of Session Equipment Utilized During Treatment: Rolling walker  OT Visit Diagnosis: Other abnormalities of gait and mobility (R26.89);Other symptoms and signs involving cognitive function   Activity Tolerance Patient tolerated treatment well   Patient Left in bed;with call bell/phone within reach;with bed alarm set   Nurse Communication Mobility status        Time: 3614-4315 OT Time Calculation (min): 18 min  Charges: OT General Charges $OT Visit: 1 Visit OT Treatments $Self Care/Home Management : 8-22 mins  Lenor Derrick., COTA/L Acute Rehabilitation Services [phone]    Barron Schmid 08/19/2020, 4:38 PM

## 2020-08-20 DIAGNOSIS — R519 Headache, unspecified: Secondary | ICD-10-CM

## 2020-08-20 DIAGNOSIS — R110 Nausea: Secondary | ICD-10-CM

## 2020-08-20 LAB — BASIC METABOLIC PANEL
Anion gap: 8 (ref 5–15)
BUN: 14 mg/dL (ref 8–23)
CO2: 24 mmol/L (ref 22–32)
Calcium: 8.8 mg/dL — ABNORMAL LOW (ref 8.9–10.3)
Chloride: 103 mmol/L (ref 98–111)
Creatinine, Ser: 1.03 mg/dL — ABNORMAL HIGH (ref 0.44–1.00)
GFR, Estimated: 54 mL/min — ABNORMAL LOW (ref >60)
Glucose, Bld: 109 mg/dL — ABNORMAL HIGH (ref 70–99)
Potassium: 4.1 mmol/L (ref 3.5–5.1)
Sodium: 135 mmol/L (ref 135–145)

## 2020-08-20 LAB — FOLATE RBC
Folate, Hemolysate: 550 ng/mL
Folate, RBC: 1467 ng/mL (ref >498)
Hematocrit: 37.5 % (ref 34.0–46.6)

## 2020-08-20 MED ORDER — ONDANSETRON HCL 4 MG PO TABS: 4.0000 mg | ORAL_TABLET | Freq: Three times a day (TID) | ORAL | 1 refills | Status: AC | PRN

## 2020-08-20 MED ORDER — LEVETIRACETAM 500 MG PO TABS: 500.0000 mg | ORAL_TABLET | Freq: Two times a day (BID) | ORAL | 0 refills | Status: DC

## 2020-08-20 MED ORDER — THIAMINE HCL 100 MG PO TABS: 100.0000 mg | ORAL_TABLET | Freq: Every day | ORAL | 1 refills | Status: AC

## 2020-08-20 NOTE — Discharge Summary (Signed)
Physician Discharge Summary  Carla White ZOX:[id number] DOB: 1935-11-17 DOA: 08/13/2020  PCP: Lupita Raider, MD  Admit date: 08/13/2020 Discharge date: 08/20/2020  Admitted From: Home Disposition: Home  Recommendations for Outpatient Follow-up:  Follow ups as below. Please obtain CBC/BMP/Mag at follow up Please follow up on the following pending results: None  Home Health: PT/OT/aide Equipment/Devices: Rolling walker and 3 in 1 commode  Discharge Condition: Stable CODE STATUS: Full code   Contact information for follow-up providers     Care, [facility] Follow up.   Specialty: Home Health Services Why: A representative from [facility] will contact you to arrange start date and time for your therapies. Contact information: [address] [place] [address] [phone]         Lupita Raider, MD. Schedule an appointment as soon as possible for a visit in 1 week(s).   Specialty: Family Medicine Contact information: [address] [place] [address] [phone]              Contact information for after-discharge care     Destination     [facility] Preferred SNF .   Service: Skilled Nursing Contact information: [address] [place] [address] [phone]                      Hospital Course: 85 year old F with no significant PMH presenting with episodes of nausea, bifrontal headache and aphasia.  CT head, CTA head and neck and MRI brain without significant finding to explain patient's presentation.  Neurology consulted and concerned about possible seizure.  Patient was started on Keppra.  EEG with nonspecific cortical dysfunction in left frontotemporal region, evidence of mild diffuse encephalopathy but no seizure or epileptiform discharge.  Blood work-up including B12, TSH and RPR within normal range.  Neurology recommended continuing Keppra on discharge  with outpatient follow-up.  She was also started on high-dose thiamine injection and discharged on p.o. thiamine.  Patient's symptoms gradually resolved.  Therapy initially recommended SNF but patient improved, and discharged home with home health and DME.  Patient has been counseled on seizure precaution and state law about driving restriction.  See individual problem list below for more hospital course  Discharge Diagnoses:  Bifrontal headache with nausea and vomiting, aphasia/word finding difficulty-resolved.  Extensive work-up but unrevealing.  Concerned about seizure, and neurology recommended Keppra 500 mg twice daily and outpatient follow-up with neurology.  Seizure precautions and driving restrictions discussed  Generalized weakness/debility: Improved. -Home health PT/OT/aide/rolling walker and bedside commode ordered. -Patient's daughter has arranged 24/7 supervision as recommended by therapy.    Body mass index is 25.59 kg/m.            Discharge Exam: Vitals:   08/20/20 0547 08/20/20 1222  BP: 128/74 117/74  Pulse: 89 94  Resp: 16 17  Temp: 98.7 F (37.1 C) 98 F (36.7 C)  SpO2: 95% 97%    GENERAL: No apparent distress.  Nontoxic. HEENT: MMM.  Vision and hearing grossly intact.  NECK: Supple.  No apparent JVD.  RESP:  No IWOB.  Fair aeration bilaterally. CVS:  RRR. Heart sounds normal.  ABD/GI/GU: Bowel sounds present. Soft. Non tender.  MSK/EXT:  Moves extremities. No apparent deformity. No edema.  SKIN: no apparent skin lesion or wound NEURO: Awake, alert and oriented appropriately.  No apparent focal neuro deficit. PSYCH: Calm. Normal affect.   Discharge Instructions  Discharge Instructions  Ambulatory referral to Neurology   Complete by: As directed    An appointment is requested in approximately: 4 weeks   Call MD for:  difficulty breathing, headache or visual disturbances   Complete by: As directed    Call MD for:  extreme fatigue   Complete  by: As directed    Call MD for:  persistant dizziness or light-headedness   Complete by: As directed    Call MD for:  persistant nausea and vomiting   Complete by: As directed    Diet general   Complete by: As directed    Discharge instructions   Complete by: As directed    It has been a pleasure taking care of you!  You were hospitalized you were hospitalized with nausea, headache and difficulty speaking.  Your CAT scan and MRI did not show stroke or any thing significant to explain his symptoms.  There is some concern about possible seizure.  You have been started on seizure medication per recommendation by neurology.  Please follow-up with neurology in 3 to 4 weeks for further evaluation.   Please follow-up with your primary care doctor in 1 to 2 weeks  Please review your new medication list and the directions on your medications before you take them  - DO not drive for 6 months/until cleared by physician   Per [facility] statutes, patients with seizures are not allowed to drive until they have been seizure-free for six months. Patient acknowledged,repeated, and understands.    Use caution when using heavy equipment or power tools. Avoid working on ladders or at heights. Take showers instead of baths. Ensure the water temperature is not too high on the home water heater. Do not go swimming alone. Do not lock yourself in a room alone (i.e. bathroom). When caring for infants or small children, sit down when holding, feeding, or changing them to minimize risk of injury to the child in the event you have a seizure. Maintain good sleep hygiene. Avoid alcohol.    If patient has another seizure, call 911 and bring them back to the ED if: A.  Any seizure-like activity or alteration of mentation or LOC - especially if  > 5 mins.      B.  The patient doesn't wake shortly after the seizure or has new problems such as difficulty seeing, speaking or moving following the seizure C.  The  patient was injured during the seizure D.  The patient has a temperature over 102 F (39C) E.  The patient vomited during the seizure and now is having trouble breathing   During the Seizure   - First, ensure adequate ventilation and place patients on the floor on their left side  Loosen clothing around the neck and ensure the airway is patent. If the patient is clenching the teeth, do not force the mouth open with any object as this can cause severe damage - Remove all items from the surrounding that can be hazardous. The patient may be oblivious to what's happening and may not even know what he or she is doing. If the patient is confused and wandering, either gently guide him/her away and block access to outside areas - Reassure the individual and be comforting - Call 911. In most cases, the seizure ends before EMS arrives. However, there are cases when seizures may last over 3 to 5 minutes. Or the individual may have developed breathing difficulties or severe injuries. If a pregnant patient or a person with diabetes  develops a seizure, it is prudent to call an ambulance. - Finally, if the patient does not regain full consciousness, then call EMS. Most patients will remain confused for about 45 to 90 minutes after a seizure, so you must use judgment in calling for help. - Avoid restraints but make sure the patient is in a bed with padded side rails - Place the individual in a lateral position with the neck slightly flexed; this will help the saliva drain from the mouth and prevent the tongue from falling backward - Remove all nearby furniture and other hazards from the area - Provide verbal assurance as the individual is regaining consciousness - Provide the patient with privacy if possible - Call for help and start treatment as ordered by the caregiver    After the Seizure (Postictal Stage)   After a seizure, most patients experience confusion, fatigue, muscle pain and/or a headache. Thus,  one should permit the individual to sleep. For the next few days, reassurance is essential. Being calm and helping reorient the person is also of importance.   Most seizures are painless and end spontaneously. Seizures are not harmful to others but can lead to complications such as stress on the lungs, brain and the heart. Individuals with prior lung problems may develop labored breathing and respiratory distress.      Take care,   Increase activity slowly   Complete by: As directed       Allergies as of 08/20/2020       Reactions   Claritin-d 24 Hour [loratadine-pseudoephedrine Er] Nausea Only   Codeine Nausea Only   Other reaction(s): stomach upset   Darvon [propoxyphene]    Other reaction(s): stomach upset   Fexofenadine    Other reaction(s): nausea   Loratadine    Other reaction(s): nausea   Penicillin G    Other reaction(s): rash   Septra [sulfamethoxazole-trimethoprim]    Other reaction(s): severe vomiting        Medication List     STOP taking these medications    meclizine 25 MG tablet Commonly known as: ANTIVERT       TAKE these medications    GLUCOSAMINE HCL PO Take 1 capsule by mouth daily.   levETIRAcetam 500 MG tablet Commonly known as: KEPPRA Take 1 tablet (500 mg total) by mouth 2 (two) times daily.   Magnesium 200 MG Tabs Take 200 mg by mouth daily.   multivitamin tablet Take 1 tablet by mouth daily.   ondansetron 4 MG tablet Commonly known as: Zofran Take 1 tablet (4 mg total) by mouth every 8 (eight) hours as needed for nausea or vomiting.   thiamine 100 MG tablet Take 1 tablet (100 mg total) by mouth daily.   Turmeric 500 MG Caps Take 500 mg by mouth daily.   VITAMIN B12 PO Take 1 tablet by mouth daily.   vitamin E 45 MG (100 UNITS) capsule Take 100 Units by mouth daily.        Consultations: Neurology  Procedures/Studies:   MR BRAIN WO CONTRAST  Result Date: 08/14/2020 CLINICAL DATA:  Acute stroke suspected EXAM:  MRI HEAD WITHOUT CONTRAST TECHNIQUE: Multiplanar, multiecho pulse sequences of the brain and surrounding structures were obtained without intravenous contrast. COMPARISON:  Head CT and CTA from yesterday FINDINGS: Brain: No acute infarction, hemorrhage, hydrocephalus, extra-axial collection or mass lesion. Chronic small vessel ischemia in the cerebral white matter that is moderate to extensive. Age normal brain volume. Small remote right cerebellar infarct. Vascular: Preserved flow voids.  There was preceding CTA. Skull and upper cervical spine: Normal marrow signal Sinuses/Orbits: Bilateral cataract resection. Other: Progressively motion degraded. IMPRESSION: 1. Motion degraded brain MRI without acute finding. 2. Extensive chronic small vessel ischemia. Electronically Signed   By: Marnee Spring M.D.   On: 08/14/2020 05:43   MR BRAIN W WO CONTRAST  Result Date: 08/16/2020 CLINICAL DATA:  TIA, episodes of nausea, headache, aphasia EXAM: MRI HEAD WITHOUT AND WITH CONTRAST TECHNIQUE: Multiplanar, multiecho pulse sequences of the brain and surrounding structures were obtained without and with intravenous contrast. CONTRAST:  7.86mL GADAVIST GADOBUTROL 1 MMOL/ML IV SOLN COMPARISON:  08/14/2020 FINDINGS: Brain: There is no acute infarction or intracranial hemorrhage. There is no intracranial mass, mass effect, or edema. There is no hydrocephalus or extra-axial fluid collection. Loss prominence of the ventricles and sulci reflects generalized parenchymal volume loss. Patchy and confluent areas of T2 hyperintensity in the supratentorial greater than pontine white matter are nonspecific but probably reflect moderate chronic microvascular ischemic changes. There are very small chronic bilateral cerebellar infarcts. No abnormal parenchymal enhancement. Prominent cortical enhancement at the vertex appears venous rather than leptomeningeal. Vascular: Major vessel flow voids at the skull base are preserved. Skull and upper  cervical spine: Normal marrow signal is preserved. Sinuses/Orbits: Paranasal sinuses are aerated. Orbits are unremarkable. Other: Sella is unremarkable.  Mastoid air cells are clear. IMPRESSION: No evidence of recent infarction, hemorrhage, or mass. No abnormal enhancement. Moderate chronic microvascular ischemic changes. Small chronic cerebellar infarcts. Electronically Signed   By: Guadlupe Spanish M.D.   On: 08/16/2020 17:42   MR Venogram Head  Result Date: 08/14/2020 CLINICAL DATA:  Dural sinus thrombosis suspected. EXAM: MR VENOGRAM HEAD WITHOUT CONTRAST TECHNIQUE: Angiographic images of the intracranial venous structures were acquired using MRV technique without intravenous contrast. COMPARISON:  Brain MRI performed at the same time FINDINGS: Patent dural sinuses and symmetric deep veins. No evidence of cortical vein thrombosis. IMPRESSION: Negative for dural sinus thrombosis. Electronically Signed   By: Marnee Spring M.D.   On: 08/14/2020 05:45   CT CEREBRAL PERFUSION W CONTRAST  Result Date: 08/14/2020 CLINICAL DATA:  Initial evaluation for acute aphasia. EXAM: CT ANGIOGRAPHY HEAD AND NECK CT PERFUSION BRAIN TECHNIQUE: Multidetector CT imaging of the head and neck was performed using the standard protocol during bolus administration of intravenous contrast. Multiplanar CT image reconstructions and MIPs were obtained to evaluate the vascular anatomy. Carotid stenosis measurements (when applicable) are obtained utilizing NASCET criteria, using the distal internal carotid diameter as the denominator. Multiphase CT imaging of the brain was performed following IV bolus contrast injection. Subsequent parametric perfusion maps were calculated using RAPID software. CONTRAST:  OMNIPAQUE IOHEXOL 350 MG/ML SOLN COMPARISON:  None available. FINDINGS: CT HEAD FINDINGS Brain: Generalized age-related cerebral atrophy with moderate chronic microvascular ischemic disease. No acute intracranial hemorrhage. No  acute large vessel territory infarct. No mass lesion or midline shift. No hydrocephalus or extra-axial fluid collection. Vascular: No hyperdense vessel. Scattered vascular calcifications noted within the carotid siphons. Skull: No visible scalp soft tissue abnormality.  Calvarium intact. Sinuses/Orbits: Globes and orbital soft tissues demonstrate no acute finding. Paranasal sinuses are largely clear. Sequelae of chronic left maxillary sinusitis noted. No mastoid effusion. Other: None. ASPECTS (Alberta Stroke Program Early CT Score) - Ganglionic level infarction (caudate, lentiform nuclei, internal capsule, insula, M1-M3 cortex): 7 - Supraganglionic infarction (M4-M6 cortex): 3 Total score (0-10 with 10 being normal): 10 Review of the MIP images confirms the above findings CTA NECK FINDINGS Aortic arch: Visualized  aortic arch normal caliber with normal 3 vessel morphology. Mild atheromatous change about the arch itself. No hemodynamically significant stenosis about the origin the great vessels. Right carotid system: Right common and internal carotid arteries are diffusely tortuous but widely patent without stenosis, dissection or occlusion. Left carotid system: Left CCA tortuous but is widely patent to the bifurcation. Eccentric soft plaque at the proximal left ICA without significant stenosis. Left ICA mildly tortuous but widely patent distally without stenosis, dissection or occlusion. Vertebral arteries: Both vertebral arteries arise from the subclavian arteries. No proximal subclavian artery stenosis. Left vertebral artery dominant. Vertebral arteries are tortuous but widely patent to the skull base without stenosis, dissection or occlusion. Skeleton: No visible acute osseous abnormality. Mild chronic height loss noted at the T4 vertebral body. No discrete or worrisome osseous lesions. Moderate spondylosis present at C4-5 through C6-7. Torus mandibularis noted. Other neck: No other acute soft tissue abnormality  within the neck. No mass or adenopathy. Upper chest: Visualized upper chest demonstrates no acute finding. Review of the MIP images confirms the above findings CTA HEAD FINDINGS Anterior circulation: Petrous segments widely patent. Mild atheromatous change within the carotid siphons without hemodynamically significant stenosis. ICAs are somewhat dolichoectatic through the carotid siphons. A1 segments patent bilaterally. Normal anterior communicating artery complex. Right ACA widely patent. Short-segment moderate left A2 stenosis noted (series 8, image 79). No M1 stenosis or occlusion. Normal MCA bifurcations. Distal MCA branches well perfused and symmetric. Patent Posterior circulation: Both vertebral arteries patent to the vertebrobasilar junction without stenosis. Left vertebral artery dominant. Both PICA origins patent and normal. Basilar patent to its distal aspect without stenosis. Superior cerebellar arteries patent bilaterally. Right PCA supplied via the basilar. Multifocal moderate stenoses noted involving the mid and distal right P2 segment. Fetal type origin of the left PCA. Left PCA well perfused to its distal aspect without stenosis. Venous sinuses: Patent allowing for timing of the contrast bolus. Anatomic variants: Fetal type origin of the left PCA. Dominant left vertebral artery. No intracranial aneurysm. Review of the MIP images confirms the above findings CT Brain Perfusion Findings: ASPECTS: 10 CBF (<30%) Volume: 0mL Perfusion (Tmax>6.0s) volume: 3mL Mismatch Volume: 3mL Infarction Location:No evidence for acute ischemia by CT perfusion. Apparent 3 cc perfusion deficit at the posterior right parietal vertex favored to be artifactual. No definite perfusion abnormality. IMPRESSION: CT HEAD IMPRESSION: 1. No acute intracranial abnormality identified. 2. Aspects = 10. 3. Age-related cerebral atrophy with moderate chronic microvascular ischemic disease. CTA HEAD AND NECK IMPRESSION: 1. Negative CTA for  emergent large vessel occlusion. 2. Intracranial atherosclerotic disease with associated moderate left A2 and right P2 stenoses. No other hemodynamically significant or correctable stenosis. 3. Diffuse tortuosity of the major arterial vasculature of the head and neck, suggesting chronic underlying hypertension. CT PERFUSION IMPRESSION: Negative CT perfusion. No evidence for acute core infarct or other perfusion abnormality. Critical Value/emergent results were called by telephone at the time of interpretation on 08/13/2020 at 11:25 pm and again at 11:40 pm to provider [facility], who verbally acknowledged these results. Electronically Signed   By: Rise Mu M.D.   On: 08/14/2020 00:17   EEG adult  Result Date: 08/15/2020 Charlsie Quest, MD     08/15/2020  8:37 AM Patient Name: DEBAR PLATE MRN: [id number] Epilepsy Attending: Charlsie Quest Referring Physician/Provider: Dr Bing Neighbors Date: 08/15/2020 Duration: 24.05 mins Patient history: 85 year old female who presented with episode of aphasia nausea and bifrontal headaches.  EEG to evaluate for seizures. Level  of alertness: Awake AEDs during EEG study: Keppra Technical aspects: This EEG study was done with scalp electrodes positioned according to the 10-20 International system of electrode placement. Electrical activity was acquired at a sampling rate of 500Hz  and reviewed with a high frequency filter of 70Hz  and a low frequency filter of 1Hz . EEG data were recorded continuously and digitally stored. Description: The posterior dominant rhythm consists of 9-10 Hz activity of moderate voltage (25-35 uV) seen predominantly in posterior head regions, symmetric and reactive to eye opening and eye closing. EEG showed intermittent generalized and maximal left frontotemporal region 3 to 6 Hz theta-delta slowing. Hyperventilation and photic stimulation were not performed.   ABNORMALITY - Intermittent slow, generalized and maximal left  frontotemporal region IMPRESSION: This study is suggestive of nonspecific cortical dysfunction in left frontotemporal region. Additionally there is evidence of mild diffuse encephalopathy, nonspecific etiology.No seizures or epileptiform discharges were seen throughout the recording. Priyanka   Overnight EEG with video  Result Date: 08/15/2020 , MD     08/15/2020 10:56 AM Patient Name: TONDA WIEDERHOLD MRN: Charlsie Quest Epilepsy Attending: 10/15/2020 Referring Physician/Provider: Dr Vincente Poli Duration: 08/15/2020 0300 to 08/15/2020 0930  Patient history: 85 year old female who presented with episode of aphasia nausea and bifrontal headaches.  EEG to evaluate for seizures.  Level of alertness: Awake, asleep  AEDs during EEG study: Keppra  Technical aspects: This EEG study was done with scalp electrodes positioned according to the 10-20 International system of electrode placement. Electrical activity was acquired at a sampling rate of 500Hz  and reviewed with a high frequency filter of 70Hz  and a low frequency filter of 1Hz . EEG data were recorded continuously and digitally stored.  Description: The posterior dominant rhythm consists of 9-10 Hz activity of moderate voltage (25-35 uV) seen predominantly in posterior head regions, symmetric and reactive to eye opening and eye closing.  Sleep was characterized by sleep spindles (12 to 14 Hz): No frontocentral region.  EEG showed intermittent generalized and maximal left frontotemporal region 3 to 6 Hz theta-delta slowing. Hyperventilation and photic stimulation were not performed.    ABNORMALITY - Intermittent slow, generalized and maximal left frontotemporal region  IMPRESSION: This study is suggestive of nonspecific cortical dysfunction in left frontotemporal region. Additionally there is evidence of mild diffuse encephalopathy, nonspecific etiology.No seizures or epileptiform discharges were seen throughout the recording.   Priyanka 10/15/2020  CT HEAD CODE STROKE WO CONTRAST  Result Date: 08/14/2020 CLINICAL DATA:  Initial evaluation for acute aphasia. EXAM: CT ANGIOGRAPHY HEAD AND NECK CT PERFUSION BRAIN TECHNIQUE: Multidetector CT imaging of the head and neck was performed using the standard protocol during bolus administration of intravenous contrast. Multiplanar CT image reconstructions and MIPs were obtained to evaluate the vascular anatomy. Carotid stenosis measurements (when applicable) are obtained utilizing NASCET criteria, using the distal internal carotid diameter as the denominator. Multiphase CT imaging of the brain was performed following IV bolus contrast injection. Subsequent parametric perfusion maps were calculated using RAPID software. CONTRAST:  91 OMNIPAQUE IOHEXOL 350 MG/ML SOLN COMPARISON:  None available. FINDINGS: CT HEAD FINDINGS Brain: Generalized age-related cerebral atrophy with moderate chronic microvascular ischemic disease. No acute intracranial hemorrhage. No acute large vessel territory infarct. No mass lesion or midline shift. No hydrocephalus or extra-axial fluid collection. Vascular: No hyperdense vessel. Scattered vascular calcifications noted within the carotid siphons. Skull: No visible scalp soft tissue abnormality.  Calvarium intact. Sinuses/Orbits: Globes and orbital soft tissues demonstrate no acute finding. Paranasal sinuses  are largely clear. Sequelae of chronic left maxillary sinusitis noted. No mastoid effusion. Other: None. ASPECTS (Alberta Stroke Program Early CT Score) - Ganglionic level infarction (caudate, lentiform nuclei, internal capsule, insula, M1-M3 cortex): 7 - Supraganglionic infarction (M4-M6 cortex): 3 Total score (0-10 with 10 being normal): 10 Review of the MIP images confirms the above findings CTA NECK FINDINGS Aortic arch: Visualized aortic arch normal caliber with normal 3 vessel morphology. Mild atheromatous change about the arch itself. No hemodynamically significant  stenosis about the origin the great vessels. Right carotid system: Right common and internal carotid arteries are diffusely tortuous but widely patent without stenosis, dissection or occlusion. Left carotid system: Left CCA tortuous but is widely patent to the bifurcation. Eccentric soft plaque at the proximal left ICA without significant stenosis. Left ICA mildly tortuous but widely patent distally without stenosis, dissection or occlusion. Vertebral arteries: Both vertebral arteries arise from the subclavian arteries. No proximal subclavian artery stenosis. Left vertebral artery dominant. Vertebral arteries are tortuous but widely patent to the skull base without stenosis, dissection or occlusion. Skeleton: No visible acute osseous abnormality. Mild chronic height loss noted at the T4 vertebral body. No discrete or worrisome osseous lesions. Moderate spondylosis present at C4-5 through C6-7. Torus mandibularis noted. Other neck: No other acute soft tissue abnormality within the neck. No mass or adenopathy. Upper chest: Visualized upper chest demonstrates no acute finding. Review of the MIP images confirms the above findings CTA HEAD FINDINGS Anterior circulation: Petrous segments widely patent. Mild atheromatous change within the carotid siphons without hemodynamically significant stenosis. ICAs are somewhat dolichoectatic through the carotid siphons. A1 segments patent bilaterally. Normal anterior communicating artery complex. Right ACA widely patent. Short-segment moderate left A2 stenosis noted (series 8, image 79). No M1 stenosis or occlusion. Normal MCA bifurcations. Distal MCA branches well perfused and symmetric. Patent Posterior circulation: Both vertebral arteries patent to the vertebrobasilar junction without stenosis. Left vertebral artery dominant. Both PICA origins patent and normal. Basilar patent to its distal aspect without stenosis. Superior cerebellar arteries patent bilaterally. Right PCA  supplied via the basilar. Multifocal moderate stenoses noted involving the mid and distal right P2 segment. Fetal type origin of the left PCA. Left PCA well perfused to its distal aspect without stenosis. Venous sinuses: Patent allowing for timing of the contrast bolus. Anatomic variants: Fetal type origin of the left PCA. Dominant left vertebral artery. No intracranial aneurysm. Review of the MIP images confirms the above findings CT Brain Perfusion Findings: ASPECTS: 10 CBF (<30%) Volume: 0mL Perfusion (Tmax>6.0s) volume: 3mL Mismatch Volume: 3mL Infarction Location:No evidence for acute ischemia by CT perfusion. Apparent 3 cc perfusion deficit at the posterior right parietal vertex favored to be artifactual. No definite perfusion abnormality. IMPRESSION: CT HEAD IMPRESSION: 1. No acute intracranial abnormality identified. 2. Aspects = 10. 3. Age-related cerebral atrophy with moderate chronic microvascular ischemic disease. CTA HEAD AND NECK IMPRESSION: 1. Negative CTA for emergent large vessel occlusion. 2. Intracranial atherosclerotic disease with associated moderate left A2 and right P2 stenoses. No other hemodynamically significant or correctable stenosis. 3. Diffuse tortuosity of the major arterial vasculature of the head and neck, suggesting chronic underlying hypertension. CT PERFUSION IMPRESSION: Negative CT perfusion. No evidence for acute core infarct or other perfusion abnormality. Critical Value/emergent results were called by telephone at the time of interpretation on 08/13/2020 at 11:25 pm and again at 11:40 pm to provider [facility], who verbally acknowledged these results. Electronically Signed   By: Rise Mu M.D.   On: 08/14/2020  00:17   CT ANGIO HEAD CODE STROKE  Result Date: 08/14/2020 CLINICAL DATA:  Initial evaluation for acute aphasia. EXAM: CT ANGIOGRAPHY HEAD AND NECK CT PERFUSION BRAIN TECHNIQUE: Multidetector CT imaging of the head and neck was performed using the  standard protocol during bolus administration of intravenous contrast. Multiplanar CT image reconstructions and MIPs were obtained to evaluate the vascular anatomy. Carotid stenosis measurements (when applicable) are obtained utilizing NASCET criteria, using the distal internal carotid diameter as the denominator. Multiphase CT imaging of the brain was performed following IV bolus contrast injection. Subsequent parametric perfusion maps were calculated using RAPID software. CONTRAST:  OMNIPAQUE IOHEXOL 350 MG/ML SOLN COMPARISON:  None available. FINDINGS: CT HEAD FINDINGS Brain: Generalized age-related cerebral atrophy with moderate chronic microvascular ischemic disease. No acute intracranial hemorrhage. No acute large vessel territory infarct. No mass lesion or midline shift. No hydrocephalus or extra-axial fluid collection. Vascular: No hyperdense vessel. Scattered vascular calcifications noted within the carotid siphons. Skull: No visible scalp soft tissue abnormality.  Calvarium intact. Sinuses/Orbits: Globes and orbital soft tissues demonstrate no acute finding. Paranasal sinuses are largely clear. Sequelae of chronic left maxillary sinusitis noted. No mastoid effusion. Other: None. ASPECTS (Alberta Stroke Program Early CT Score) - Ganglionic level infarction (caudate, lentiform nuclei, internal capsule, insula, M1-M3 cortex): 7 - Supraganglionic infarction (M4-M6 cortex): 3 Total score (0-10 with 10 being normal): 10 Review of the MIP images confirms the above findings CTA NECK FINDINGS Aortic arch: Visualized aortic arch normal caliber with normal 3 vessel morphology. Mild atheromatous change about the arch itself. No hemodynamically significant stenosis about the origin the great vessels. Right carotid system: Right common and internal carotid arteries are diffusely tortuous but widely patent without stenosis, dissection or occlusion. Left carotid system: Left CCA tortuous but is widely patent to the  bifurcation. Eccentric soft plaque at the proximal left ICA without significant stenosis. Left ICA mildly tortuous but widely patent distally without stenosis, dissection or occlusion. Vertebral arteries: Both vertebral arteries arise from the subclavian arteries. No proximal subclavian artery stenosis. Left vertebral artery dominant. Vertebral arteries are tortuous but widely patent to the skull base without stenosis, dissection or occlusion. Skeleton: No visible acute osseous abnormality. Mild chronic height loss noted at the T4 vertebral body. No discrete or worrisome osseous lesions. Moderate spondylosis present at C4-5 through C6-7. Torus mandibularis noted. Other neck: No other acute soft tissue abnormality within the neck. No mass or adenopathy. Upper chest: Visualized upper chest demonstrates no acute finding. Review of the MIP images confirms the above findings CTA HEAD FINDINGS Anterior circulation: Petrous segments widely patent. Mild atheromatous change within the carotid siphons without hemodynamically significant stenosis. ICAs are somewhat dolichoectatic through the carotid siphons. A1 segments patent bilaterally. Normal anterior communicating artery complex. Right ACA widely patent. Short-segment moderate left A2 stenosis noted (series 8, image 79). No M1 stenosis or occlusion. Normal MCA bifurcations. Distal MCA branches well perfused and symmetric. Patent Posterior circulation: Both vertebral arteries patent to the vertebrobasilar junction without stenosis. Left vertebral artery dominant. Both PICA origins patent and normal. Basilar patent to its distal aspect without stenosis. Superior cerebellar arteries patent bilaterally. Right PCA supplied via the basilar. Multifocal moderate stenoses noted involving the mid and distal right P2 segment. Fetal type origin of the left PCA. Left PCA well perfused to its distal aspect without stenosis. Venous sinuses: Patent allowing for timing of the contrast  bolus. Anatomic variants: Fetal type origin of the left PCA. Dominant left vertebral artery. No intracranial aneurysm.  Review of the MIP images confirms the above findings CT Brain Perfusion Findings: ASPECTS: 10 CBF (<30%) Volume: 28mL Perfusion (Tmax>6.0s) volume: 51mL Mismatch Volume: 85mL Infarction Location:No evidence for acute ischemia by CT perfusion. Apparent 3 cc perfusion deficit at the posterior right parietal vertex favored to be artifactual. No definite perfusion abnormality. IMPRESSION: CT HEAD IMPRESSION: 1. No acute intracranial abnormality identified. 2. Aspects = 10. 3. Age-related cerebral atrophy with moderate chronic microvascular ischemic disease. CTA HEAD AND NECK IMPRESSION: 1. Negative CTA for emergent large vessel occlusion. 2. Intracranial atherosclerotic disease with associated moderate left A2 and right P2 stenoses. No other hemodynamically significant or correctable stenosis. 3. Diffuse tortuosity of the major arterial vasculature of the head and neck, suggesting chronic underlying hypertension. CT PERFUSION IMPRESSION: Negative CT perfusion. No evidence for acute core infarct or other perfusion abnormality. Critical Value/emergent results were called by telephone at the time of interpretation on 08/13/2020 at 11:25 pm and again at 11:40 pm to provider [facility], who verbally acknowledged these results. Electronically Signed   By: Rise Mu M.D.   On: 08/14/2020 00:17   CT ANGIO NECK CODE STROKE  Result Date: 08/14/2020 CLINICAL DATA:  Initial evaluation for acute aphasia. EXAM: CT ANGIOGRAPHY HEAD AND NECK CT PERFUSION BRAIN TECHNIQUE: Multidetector CT imaging of the head and neck was performed using the standard protocol during bolus administration of intravenous contrast. Multiplanar CT image reconstructions and MIPs were obtained to evaluate the vascular anatomy. Carotid stenosis measurements (when applicable) are obtained utilizing NASCET criteria, using the  distal internal carotid diameter as the denominator. Multiphase CT imaging of the brain was performed following IV bolus contrast injection. Subsequent parametric perfusion maps were calculated using RAPID software. CONTRAST:  OMNIPAQUE IOHEXOL 350 MG/ML SOLN COMPARISON:  None available. FINDINGS: CT HEAD FINDINGS Brain: Generalized age-related cerebral atrophy with moderate chronic microvascular ischemic disease. No acute intracranial hemorrhage. No acute large vessel territory infarct. No mass lesion or midline shift. No hydrocephalus or extra-axial fluid collection. Vascular: No hyperdense vessel. Scattered vascular calcifications noted within the carotid siphons. Skull: No visible scalp soft tissue abnormality.  Calvarium intact. Sinuses/Orbits: Globes and orbital soft tissues demonstrate no acute finding. Paranasal sinuses are largely clear. Sequelae of chronic left maxillary sinusitis noted. No mastoid effusion. Other: None. ASPECTS ([place]berta Stroke Program Early CT Score) - Ganglionic level infarction (caudate, lentiform nuclei, internal capsule, insula, M1-M3 cortex): 7 - Supraganglionic infarction (M4-M6 cortex): 3 Total score (0-10 with 10 being normal): 10 Review of the MIP images confirms the above findings CTA NECK FINDINGS Aortic arch: Visualized aortic arch normal caliber with normal 3 vessel morphology. Mild atheromatous change about the arch itself. No hemodynamically significant stenosis about the origin the great vessels. Right carotid system: Right common and internal carotid arteries are diffusely tortuous but widely patent without stenosis, dissection or occlusion. Left carotid system: Left CCA tortuous but is widely patent to the bifurcation. Eccentric soft plaque at the proximal left ICA without significant stenosis. Left ICA mildly tortuous but widely patent distally without stenosis, dissection or occlusion. Vertebral arteries: Both vertebral arteries arise from the subclavian  arteries. No proximal subclavian artery stenosis. Left vertebral artery dominant. Vertebral arteries are tortuous but widely patent to the skull base without stenosis, dissection or occlusion. Skeleton: No visible acute osseous abnormality. Mild chronic height loss noted at the T4 vertebral body. No discrete or worrisome osseous lesions. Moderate spondylosis present at C4-5 through C6-7. Torus mandibularis noted. Other neck: No other acute soft tissue abnormality within  the neck. No mass or adenopathy. Upper chest: Visualized upper chest demonstrates no acute finding. Review of the MIP images confirms the above findings CTA HEAD FINDINGS Anterior circulation: Petrous segments widely patent. Mild atheromatous change within the carotid siphons without hemodynamically significant stenosis. ICAs are somewhat dolichoectatic through the carotid siphons. A1 segments patent bilaterally. Normal anterior communicating artery complex. Right ACA widely patent. Short-segment moderate left A2 stenosis noted (series 8, image 79). No M1 stenosis or occlusion. Normal MCA bifurcations. Distal MCA branches well perfused and symmetric. Patent Posterior circulation: Both vertebral arteries patent to the vertebrobasilar junction without stenosis. Left vertebral artery dominant. Both PICA origins patent and normal. Basilar patent to its distal aspect without stenosis. Superior cerebellar arteries patent bilaterally. Right PCA supplied via the basilar. Multifocal moderate stenoses noted involving the mid and distal right P2 segment. Fetal type origin of the left PCA. Left PCA well perfused to its distal aspect without stenosis. Venous sinuses: Patent allowing for timing of the contrast bolus. Anatomic variants: Fetal type origin of the left PCA. Dominant left vertebral artery. No intracranial aneurysm. Review of the MIP images confirms the above findings CT Brain Perfusion Findings: ASPECTS: 10 CBF (<30%) Volume: 0mL Perfusion (Tmax>6.0s)  volume: 3mL Mismatch Volume: 3mL Infarction Location:No evidence for acute ischemia by CT perfusion. Apparent 3 cc perfusion deficit at the posterior right parietal vertex favored to be artifactual. No definite perfusion abnormality. IMPRESSION: CT HEAD IMPRESSION: 1. No acute intracranial abnormality identified. 2. Aspects = 10. 3. Age-related cerebral atrophy with moderate chronic microvascular ischemic disease. CTA HEAD AND NECK IMPRESSION: 1. Negative CTA for emergent large vessel occlusion. 2. Intracranial atherosclerotic disease with associated moderate left A2 and right P2 stenoses. No other hemodynamically significant or correctable stenosis. 3. Diffuse tortuosity of the major arterial vasculature of the head and neck, suggesting chronic underlying hypertension. CT PERFUSION IMPRESSION: Negative CT perfusion. No evidence for acute core infarct or other perfusion abnormality. Critical Value/emergent results were called by telephone at the time of interpretation on 08/13/2020 at 11:25 pm and again at 11:40 pm to [facility], who verbally acknowledged these results. Electronically Signed   By: Rise MuBenjamin  McClintock M.D.   On: 08/14/2020 00:17       The results of significant diagnostics from this hospitalization (including imaging, microbiology, ancillary and laboratory) are listed below for reference.     Microbiology: Recent Results (from the past 240 hour(s))  Resp Panel by RT-PCR (Flu A&B, Covid) Nasopharyngeal Swab     Status: None   Collection Time: 08/13/20 11:06 PM   Specimen: Nasopharyngeal Swab; Nasopharyngeal(NP) swabs in vial transport medium  Result Value Ref Range Status   SARS Coronavirus 2 by RT PCR NEGATIVE NEGATIVE Final    Comment: (NOTE) SARS-CoV-2 target nucleic acids are NOT DETECTED.  The SARS-CoV-2 RNA is generally detectable in upper respiratory specimens during the acute phase of infection. The lowest concentration of SARS-CoV-2 viral copies this assay  can detect is 138 copies/mL. A negative result does not preclude SARS-Cov-2 infection and should not be used as the sole basis for treatment or other patient management decisions. A negative result may occur with  improper specimen collection/handling, submission of specimen other than nasopharyngeal swab, presence of viral mutation(s) within the areas targeted by this assay, and inadequate number of viral copies(<138 copies/mL). A negative result must be combined with clinical observations, patient history, and epidemiological information. The expected result is Negative.  Fact Sheet for Patients:  BloggerCourse.comhttps://www.fda.gov/media/152166/download  Fact Sheet for Healthcare Providers:  SeriousBroker.it  This test is no t yet approved or cleared by the [place] and  has been authorized for detection and/or diagnosis of SARS-CoV-2 by FDA under an Emergency Use Authorization (EUA). This EUA will remain  in effect (meaning this test can be used) for the duration of the COVID-19 declaration under Section 564(b)(1) of the Act, 21 U.S.C.section 360bbb-3(b)(1), unless the authorization is terminated  or revoked sooner.       Influenza A by PCR NEGATIVE NEGATIVE Final   Influenza B by PCR NEGATIVE NEGATIVE Final    Comment: (NOTE) The Xpert Xpress SARS-CoV-2/FLU/RSV plus assay is intended as an aid in the diagnosis of influenza from Nasopharyngeal swab specimens and should not be used as a sole basis for treatment. Nasal washings and aspirates are unacceptable for Xpert Xpress SARS-CoV-2/FLU/RSV testing.  Fact Sheet for Patients: BloggerCourse.com  Fact Sheet for Healthcare Providers: SeriousBroker.it  This test is not yet approved or cleared by the [place] FDA and has been authorized for detection and/or diagnosis of SARS-CoV-2 by FDA under an Emergency Use Authorization (EUA). This EUA will  remain in effect (meaning this test can be used) for the duration of the COVID-19 declaration under Section 564(b)(1) of the Act, 21 U.S.C. section 360bbb-3(b)(1), unless the authorization is terminated or revoked.  Performed at [facility], [address], [place], [place] [address]   SARS CORONAVIRUS 2 (TAT 6-24 HRS) Nasopharyngeal Nasopharyngeal Swab     Status: None   Collection Time: 08/18/20  9:20 AM   Specimen: Nasopharyngeal Swab  Result Value Ref Range Status   SARS Coronavirus 2 NEGATIVE NEGATIVE Final    Comment: (NOTE) SARS-CoV-2 target nucleic acids are NOT DETECTED.  The SARS-CoV-2 RNA is generally detectable in upper and lower respiratory specimens during the acute phase of infection. Negative results do not preclude SARS-CoV-2 infection, do not rule out co-infections with other pathogens, and should not be used as the sole basis for treatment or other patient management decisions. Negative results must be combined with clinical observations, patient history, and epidemiological information. The expected result is Negative.  Fact Sheet for Patients: HairSlick.no  Fact Sheet for Healthcare Providers: quierodirigir.com  This test is not yet approved or cleared by the [place] FDA and  has been authorized for detection and/or diagnosis of SARS-CoV-2 by FDA under an Emergency Use Authorization (EUA). This EUA will remain  in effect (meaning this test can be used) for the duration of the COVID-19 declaration under Se ction 564(b)(1) of the Act, 21 U.S.C. section 360bbb-3(b)(1), unless the authorization is terminated or revoked sooner.  Performed at [facility], [address], [place], [place] [address]      Labs:  CBC: Recent Labs  Lab 08/13/20 2306 08/13/20 2314 08/14/20 0618 08/15/20 0203 08/17/20 0958  WBC 11.2*  --  9.2 8.3  --   NEUTROABS 8.7*  --   --   --   --   HGB 13.0  13.3 11.9* 12.1  --   HCT 38.4 39.0 34.3* 35.8* 37.5  MCV 95.5  --  96.6 97.0  --   PLT 352  --  337 305  --    BMP &GFR Recent Labs  Lab 08/13/20 2320 08/14/20 0618 08/15/20 0203 08/16/20 0916 08/17/20 1000 08/20/20 0111  NA  --  133* 136 136 136 135  K  --  4.1 4.3 4.6 3.9 4.1  CL  --  97* 104 105 102 103  CO2  --  27 26 23 27 24   GLUCOSE  --  107* 94 92 104* 109*  BUN  --  13 10 8 10 14   CREATININE  --  1.03* 1.04* 1.02* 0.97 1.03*  CALCIUM  --  9.0 8.8* 8.9 9.0 8.8*  MG 2.0  --  2.2 2.0  --   --    Estimated Creatinine Clearance: 43.5 mL/min (A) (by C-G formula based on SCr of 1.03 mg/dL (H)). Liver & Pancreas: Recent Labs  Lab 08/13/20 2306  AST 28  ALT 21  ALKPHOS 102  BILITOT 0.8  PROT 7.0  ALBUMIN 3.9   No results for input(s): LIPASE, AMYLASE in the last 168 hours. No results for input(s): AMMONIA in the last 168 hours. Diabetic: No results for input(s): HGBA1C in the last 72 hours. Recent Labs  Lab 08/13/20 2307  GLUCAP 138*   Cardiac Enzymes: No results for input(s): CKTOTAL, CKMB, CKMBINDEX, TROPONINI in the last 168 hours. No results for input(s): PROBNP in the last 8760 hours. Coagulation Profile: Recent Labs  Lab 08/13/20 2306  INR 1.1   Thyroid Function Tests: No results for input(s): TSH, T4TOTAL, FREET4, T3FREE, THYROIDAB in the last 72 hours. Lipid Profile: No results for input(s): CHOL, HDL, LDLCALC, TRIG, CHOLHDL, LDLDIRECT in the last 72 hours. Anemia Panel: No results for input(s): VITAMINB12, FOLATE, FERRITIN, TIBC, IRON, RETICCTPCT in the last 72 hours. Urine analysis:    Component Value Date/Time   COLORURINE YELLOW 08/14/2020 0230   APPEARANCEUR CLEAR 08/14/2020 0230   LABSPEC >1.046 (H) 08/14/2020 0230   PHURINE 7.0 08/14/2020 0230   GLUCOSEU NEGATIVE 08/14/2020 0230   HGBUR NEGATIVE 08/14/2020 0230   BILIRUBINUR NEGATIVE 08/14/2020 0230   KETONESUR 20 (A) 08/14/2020 0230   PROTEINUR NEGATIVE 08/14/2020 0230   NITRITE  NEGATIVE 08/14/2020 0230   LEUKOCYTESUR NEGATIVE 08/14/2020 0230   Sepsis Labs: Invalid input(s): PROCALCITONIN, LACTICIDVEN   Time coordinating discharge: 35 minutes  SIGNED:  Almon Hercules, MD  [facility] 08/20/2020, 10:21 PM  If 7PM-7AM, please contact night-coverage [email or web address]

## 2020-08-20 NOTE — Progress Notes (Signed)
Physical Therapy Treatment Patient Details Name: MCKINSLEY KOELZER MRN: [id number] DOB: 1935/07/08 Today's Date: 08/20/2020    History of Present Illness 85 y.o. female presents to [facility] ED on 08/13/2020 with aphasia and bifrontal headache. CT and MRI negative. EEG suggestive of nonspecific cortical dysfunction in left frontotemporal region. Additionally there is evidence of mild diffuse encephalopathy, no seizures noted. No significant PMH noted.    PT Comments    Patient received in bed, pleasant and cooperative. Seemed a bit more impulsive and less aware of safety today, however able to maintain balance with min guard without device. Tends to move a bit faster than is necessarily safe but feel this may be close to baseline. Cognition does tend to wax and wane so continue to recommend 24/7 S at DC.Tolerated stairs well today- do not see any reason why she could not handle stairs at family home. Left up in recliner with all needs met, chair alarm active. Progressing well, will continue to follow.    Follow Up Recommendations  Home health PT;Supervision/Assistance - 24 hour     Equipment Recommendations  None recommended by PT    Recommendations for Other Services       Precautions / Restrictions Precautions Precautions: Fall Restrictions Weight Bearing Restrictions: No    Mobility  Bed Mobility Overal bed mobility: Needs Assistance Bed Mobility: Supine to Sit     Supine to sit: Modified independent (Device/Increase time)     General bed mobility comments: no physical assist needed, use of bed features    Transfers Overall transfer level: Needs assistance Equipment used: None Transfers: Sit to/from Stand Sit to Stand: Min guard         General transfer comment: min guard for safety, no physical assist given but did need cues for hand placement  Ambulation/Gait Ambulation/Gait assistance: Min guard Gait Distance (Feet): 180 Feet Assistive device: None Gait  Pattern/deviations: Drifts right/left;Step-through pattern Gait velocity: decreased   General Gait Details: did well without assistive device today- tends to walk a bit faster than is necessarily safe, but did not need external assist to maintain balance. Did need max cues for navigation in hallway however.   Stairs Stairs: Yes Stairs assistance: Min guard Stair Management: One rail Right;Step to pattern;Forwards Number of Stairs: 14 General stair comments: min guard for safety and use of R rail, intermittent use of B rails with ascent but not truly needed. had lack of eccentric control on descent but able to improve with cues.   Wheelchair Mobility    Modified Rankin (Stroke Patients Only)       Balance Overall balance assessment: Needs assistance Sitting-balance support: No upper extremity supported;Feet supported Sitting balance-Leahy Scale: Normal     Standing balance support: No upper extremity supported;During functional activity Standing balance-Leahy Scale: Good Standing balance comment: able to mobilize in hallway without assistive device                            Cognition Arousal/Alertness: Awake/alert Behavior During Therapy: [facility] for tasks assessed/performed;Impulsive Overall Cognitive Status: Impaired/Different from baseline Area of Impairment: Orientation;Safety/judgement;Awareness;Problem solving                 Orientation Level: Disoriented to;Situation Current Attention Level: Sustained Memory: Decreased short-term memory   Safety/Judgement: Decreased awareness of deficits;Decreased awareness of safety Awareness: Intellectual Problem Solving: Requires verbal cues General Comments: cognition still slowly improving but easily distracted and especially challenged by multitasking and dynamic environments- attention  span quite short in functional situations. A bit impulsive requiring verbal cues but question how close this is to baseline.       Exercises      General Comments        Pertinent Vitals/Pain Pain Assessment: No/denies pain Faces Pain Scale: No hurt    Home Living                      Prior Function            PT Goals (current goals can now be found in the care plan section) Acute Rehab PT Goals Patient Stated Goal: to go home PT Goal Formulation: With patient Time For Goal Achievement: 08/29/20 Potential to Achieve Goals: Good Additional Goals Additional Goal #1: Pt will score >19/24 on DGI to indicate a reduced risk for falls. Progress towards PT goals: Progressing toward goals    Frequency    Min 3X/week      PT Plan Discharge plan needs to be updated;Equipment recommendations need to be updated    Co-evaluation              AM-PAC PT "6 Clicks" Mobility   Outcome Measure  Help needed turning from your back to your side while in a flat bed without using bedrails?: None Help needed moving from lying on your back to sitting on the side of a flat bed without using bedrails?: None Help needed moving to and from a bed to a chair (including a wheelchair)?: None Help needed standing up from a chair using your arms (e.g., wheelchair or bedside chair)?: None Help needed to walk in hospital room?: None Help needed climbing 3-5 steps with a railing? : A Little 6 Click Score: 23    End of Session Equipment Utilized During Treatment: Gait belt Activity Tolerance: Patient tolerated treatment well Patient left: in chair;with call bell/phone within reach;with chair alarm set Nurse Communication: Mobility status PT Visit Diagnosis: Unsteadiness on feet (R26.81);Other abnormalities of gait and mobility (R26.89)     Time: 4098-1191 PT Time Calculation (min) (ACUTE ONLY): 14 min  Charges:  $Gait Training: 8-22 mins                    Windell Norfolk, DPT, PN1   Supplemental Physical Therapist [place]    Pager [phone] Acute Rehab Office [phone]

## 2020-08-20 NOTE — Progress Notes (Signed)
Physical Therapy Treatment Patient Details Name: Carla White MRN: [id number] DOB: 1935-07-10 Today's Date: 08/20/2020    History of Present Illness 85 y.o. female presents to [facility] ED on 08/13/2020 with aphasia and bifrontal headache. CT and MRI negative. EEG suggestive of nonspecific cortical dysfunction in left frontotemporal region. Additionally there is evidence of mild diffuse encephalopathy, no seizures noted. No significant PMH noted.    PT Comments    Patient received sitting at EOB with daughter present. Session focus on family education this afternoon- spoke with daughter Peter Congo about how patient has been doing with PT/OT, as well as concern over waxing/waning cognitive impairment and relation to functional implications/safety at home (pill box test failure, safety in the kitchen, sequencing tasks, etc), as well as firm recommendations for 24/7 supervision at this time. Also discussed role of HHPT in helping to progress patient in home setting and answered questions within PT scope about functional prognosis moving forward- did encourage her to speak in depth with RN and MD about remaining medical and pharmacological questions. Left sitting at EOB with daughter present, all needs otherwise met. Thank you for the opportunity to participate in her care!    Follow Up Recommendations  Home health PT;Supervision/Assistance - 24 hour     Equipment Recommendations  None recommended by PT    Recommendations for Other Services       Precautions / Restrictions Precautions Precautions: Fall Restrictions Weight Bearing Restrictions: No    Mobility  Bed Mobility Overal bed mobility: Modified Independent Bed Mobility: Sit to Supine       Sit to supine: Modified independent (Device/Increase time)   General bed mobility comments: session focus on family ed    Transfers Overall transfer level: Needs assistance Equipment used: None Transfers: Sit to/from Stand Sit to Stand:  Supervision         General transfer comment: session focus on family ed  Ambulation/Gait             General Gait Details: session focus on family ed   Stairs             Wheelchair Mobility    Modified Rankin (Stroke Patients Only)       Balance Overall balance assessment: Needs assistance Sitting-balance support: No upper extremity supported;Feet supported Sitting balance-Leahy Scale: Normal     Standing balance support: No upper extremity supported;During functional activity Standing balance-Leahy Scale: Good Standing balance comment: able to mobilize in hallway without assistive device                            Cognition Arousal/Alertness: Awake/alert Behavior During Therapy: WFL for tasks assessed/performed Overall Cognitive Status: Impaired/Different from baseline Area of Impairment: Safety/judgement;Awareness;Problem solving;Memory;Following commands                     Memory: Decreased short-term memory Following Commands: Follows one step commands inconsistently;Follows one step commands with increased time Safety/Judgement: Decreased awareness of deficits Awareness: Intellectual Problem Solving: Requires verbal cues;Slow processing;Decreased initiation;Difficulty sequencing General Comments: completed pillbox assessment with pt, with pt recieving a failing score on assessment. pt needed MAX cues to sequence the task and follow directions, pt noted to not read labels on pill bottles and began randomly inserting them into slot. pt with no awareness to deficits and reports she will be able to do it at home, highly encouraged pt to have someone else set up her pills for her at home  Exercises      General Comments        Pertinent Vitals/Pain Pain Assessment: No/denies pain Pain Score: 0-No pain Faces Pain Scale: No hurt Pain Intervention(s): Limited activity within patient's tolerance;Monitored during session     Home Living                      Prior Function            PT Goals (current goals can now be found in the care plan section) Acute Rehab PT Goals Patient Stated Goal: to go home PT Goal Formulation: With patient Time For Goal Achievement: 08/29/20 Potential to Achieve Goals: Good Additional Goals Additional Goal #1: Pt will score >19/24 on DGI to indicate a reduced risk for falls. Progress towards PT goals: Progressing toward goals    Frequency    Min 3X/week      PT Plan Current plan remains appropriate    Co-evaluation              AM-PAC PT "6 Clicks" Mobility   Outcome Measure  Help needed turning from your back to your side while in a flat bed without using bedrails?: None Help needed moving from lying on your back to sitting on the side of a flat bed without using bedrails?: None Help needed moving to and from a bed to a chair (including a wheelchair)?: None Help needed standing up from a chair using your arms (e.g., wheelchair or bedside chair)?: None Help needed to walk in hospital room?: None Help needed climbing 3-5 steps with a railing? : A Little 6 Click Score: 23    End of Session   Activity Tolerance: Patient tolerated treatment well Patient left: in bed;with call bell/phone within reach;with bed alarm set (sitting at EOB with daughter present) Nurse Communication: Mobility status PT Visit Diagnosis: Unsteadiness on feet (R26.81);Other abnormalities of gait and mobility (R26.89)     Time: 7841-2820 PT Time Calculation (min) (ACUTE ONLY): 11 min  Charges:  $Self Care/Home Management: 8-22                    Windell Norfolk, DPT, PN1   Supplemental Physical Therapist [place]    Pager [phone] Acute Rehab Office [phone]

## 2020-08-20 NOTE — Progress Notes (Signed)
Pt & daughter given discharge instructions, prescriptions, and care notes. Pt verbalized understanding AEB no further questions or concerns at this time. IV was discontinued, no redness, pain, or swelling noted at this time. Pt left the floor via wheelchair with staff in stable condition.

## 2020-08-20 NOTE — Progress Notes (Signed)
Occupational Therapy Treatment Patient Details Name: Carla White MRN: [id number] DOB: December 04, 1935 Today's Date: 08/20/2020    History of present illness 85 y.o. female presents to [facility] ED on 08/13/2020 with aphasia and bifrontal headache. CT and MRI negative. EEG suggestive of nonspecific cortical dysfunction in left frontotemporal region. Additionally there is evidence of mild diffuse encephalopathy, no seizures noted. No significant PMH noted.   OT comments  Pt making steady progress towards OT goals this session. Session focus on administration of pillbox assessment and continued functional mobility progression and BADL reeducation. Pt continues to present with impaired cognition and impaired safety awareness. Pt currently requires Min guard assist- gross supervision for functional mobility with no AD and supervision for standing grooming tasks.   Assessed using the Pill Box Test. Pt failed the assessment, demonstrating poor planning, mental flexibility, suboptimal search strategies, concrete thinking and the inability to multitask. Pt was unable to place one pill correctly based on the instructions provided, where more than 3 errors is considered a fail. Pt did complete task in under 5 mins. Highly recommend pt to have assistance with med mgmt at home. Pt would continue to benefit from skilled occupational therapy while admitted and after d/c to address the below listed limitations in order to improve overall functional mobility and facilitate independence with BADL participation. DC plan remains appropriate, will follow acutely per POC.       Follow Up Recommendations  SNF;Supervision/Assistance - 24 hour;Home health OT    Equipment Recommendations  3 in 1 bedside commode;Tub/shower seat    Recommendations for Other Services      Precautions / Restrictions Precautions Precautions: Fall Restrictions Weight Bearing Restrictions: No       Mobility Bed Mobility Overal bed  mobility: Modified Independent Bed Mobility: Sit to Supine       Sit to supine: Modified independent (Device/Increase time)   General bed mobility comments: no physical assist needed, use of bed features    Transfers Overall transfer level: Needs assistance Equipment used: None Transfers: Sit to/from Stand Sit to Stand: Supervision         General transfer comment: supervision for safety from EOB and toilet    Balance Overall balance assessment: Needs assistance Sitting-balance support: No upper extremity supported;Feet supported Sitting balance-Leahy Scale: Normal     Standing balance support: No upper extremity supported;During functional activity Standing balance-Leahy Scale: Good                             ADL either performed or assessed with clinical judgement   ADL Overall ADL's : Needs assistance/impaired     Grooming: Wash/dry hands;Standing;Supervision/safety                   Toilet Transfer: Min guard;Ambulation;Regular Teacher, adult education Details (indicate cue type and reason): min guard - gross supervision for safety Toileting- Clothing Manipulation and Hygiene: Supervision/safety;Sitting/lateral lean       Functional mobility during ADLs: Supervision/safety;Min guard General ADL Comments: pt continues to present with cognitive deficits and  impaired safety awareness     Vision       Perception     Praxis      Cognition Arousal/Alertness: Awake/alert Behavior During Therapy: WFL for tasks assessed/performed Overall Cognitive Status: Impaired/Different from baseline Area of Impairment: Safety/judgement;Awareness;Problem solving;Memory;Following commands                     Memory: Decreased short-term memory (  repeated cues) Following Commands: Follows one step commands inconsistently;Follows one step commands with increased time Safety/Judgement: Decreased awareness of deficits Awareness:  Intellectual Problem Solving: Requires verbal cues;Slow processing;Decreased initiation;Difficulty sequencing General Comments: completed pillbox assessment with pt, with pt recieving a failing score on assessment. pt needed MAX cues to sequence the task and follow directions, pt noted to not read labels on pill bottles and began randomly inserting them into slot. pt with no awareness to deficits and reports she will be able to do it at home, highly encouraged pt to have someone else set up her pills for her at home        Exercises     Shoulder Instructions       General Comments      Pertinent Vitals/ Pain       Pain Assessment: Faces Faces Pain Scale: No hurt  Home Living                                          Prior Functioning/Environment              Frequency  Min 2X/week        Progress Toward Goals  OT Goals(current goals can now be found in the care plan section)  Progress towards OT goals: Progressing toward goals  Acute Rehab OT Goals Patient Stated Goal: to go home OT Goal Formulation: With patient Time For Goal Achievement: 08/30/20 Potential to Achieve Goals: Good  Plan Discharge plan remains appropriate;Frequency remains appropriate    Co-evaluation                 AM-PAC OT "6 Clicks" Daily Activity     Outcome Measure   Help from another person eating meals?: None Help from another person taking care of personal grooming?: None Help from another person toileting, which includes using toliet, bedpan, or urinal?: A Little Help from another person bathing (including washing, rinsing, drying)?: A Little Help from another person to put on and taking off regular upper body clothing?: None Help from another person to put on and taking off regular lower body clothing?: A Little 6 Click Score: 21    End of Session    OT Visit Diagnosis: Other abnormalities of gait and mobility (R26.89);Other symptoms and signs involving  cognitive function   Activity Tolerance Patient tolerated treatment well   Patient Left in bed;with call bell/phone within reach;with bed alarm set   Nurse Communication Mobility status        Time: 1350-1409 OT Time Calculation (min): 19 min  Charges: OT General Charges $OT Visit: 1 Visit OT Treatments $Self Care/Home Management : 8-22 mins  Lenor Derrick., COTA/L Acute Rehabilitation Services [phone]    Barron Schmid 08/20/2020, 3:45 PM

## 2020-08-20 NOTE — TOC Progression Note (Signed)
Transition of Care [facility]) - Progression Note    Patient Details  Name: Carla White MRN: [id number] Date of Birth: March 02, 1936  Transition of [facility] CM/SW Contact  Huston Foley Jacklynn Ganong, RN Phone Number: 08/20/2020, 11:47 AM  Clinical Narrative:  Case manager contacted patient's daughter at the request of Dr.Gonfa, to discuss patient's care at discharge plan. Dianna states that they have a lady that can be with patient 24/7, if patient agrees. She also said that her mom will not come to stay with her because she has 2 dogs and 2 cats. She is glad her mom can do stairs. Steward Drone also is willing to stay at her mom's home, if she is agreeable to that.    Expected Discharge Plan: Home w Home Health Services Barriers to Discharge: No Barriers Identified  Expected Discharge Plan and Services Expected Discharge Plan: Home w Home Health Services In-house Referral: Clinical Social Work Discharge Planning Services: CM Consult Post Acute Care Choice: Home Health, Durable Medical Equipment Living arrangements for the past 2 months: Single Family Home Expected Discharge Date: 08/20/20               DME Arranged: Cherre Huger rolling DME Agency: [employer] Date DME Agency Contacted: 08/19/20 Time DME Agency Contacted: [phone] Representative spoke with at DME Agency: Velna Hatchet HH Arranged: PT, OT, Nurse's Aide HH Agency: [facility] Date [facility] Agency Contacted: 08/19/20 Time HH Agency Contacted: [phone] Representative spoke with at [facility] Agency: Kandee Keen   Social Determinants of Health (SDOH) Interventions    Readmission Risk Interventions No flowsheet data found.

## 2020-08-24 LAB — VITAMIN B1: Vitamin B1 (Thiamine): 157 nmol/L (ref 66.5–200.0)

## 2020-09-05 DIAGNOSIS — G43909 Migraine, unspecified, not intractable, without status migrainosus: Secondary | ICD-10-CM | POA: Diagnosis not present

## 2020-09-05 DIAGNOSIS — G40909 Epilepsy, unspecified, not intractable, without status epilepticus: Secondary | ICD-10-CM | POA: Diagnosis not present

## 2020-09-11 ENCOUNTER — Telehealth: Payer: Self-pay | Admitting: Neurology

## 2020-09-11 NOTE — Telephone Encounter (Signed)
The patient apparently was seen in the emergency room on 13 August 2020.  The patient had episodes of nausea and bifrontal headache and aphasia.  CT of the head, CTA of the head, CTA of the neck, and MRI of the brain did not show any cause of the episodes.  The patient was placed on Keppra for presumed seizures.  EEG showed nonspecific cortical dysfunction in the left frontotemporal area.  The patient does not feel well on the Keppra, she has not been seen in our practice yet for an evaluation, she has an appointment in August with they believe that she needs to be seen sooner.  She has an appointment with our new physician, Dr. Cynda Familia.  We will try to get an earlier appointment for her.

## 2020-09-16 ENCOUNTER — Ambulatory Visit (INDEPENDENT_AMBULATORY_CARE_PROVIDER_SITE_OTHER): Payer: PPO | Admitting: Diagnostic Neuroimaging

## 2020-09-16 ENCOUNTER — Encounter: Payer: Self-pay | Admitting: Diagnostic Neuroimaging

## 2020-09-16 VITALS — BP 137/83 | HR 98 | Ht 67.0 in | Wt 159.8 lb

## 2020-09-16 DIAGNOSIS — R4781 Slurred speech: Secondary | ICD-10-CM

## 2020-09-16 DIAGNOSIS — R4701 Aphasia: Secondary | ICD-10-CM

## 2020-09-16 NOTE — Progress Notes (Signed)
[facility]  PATIENT: Carla White DOB: 06/04/1935  REFERRING CLINICIAN: Lupita Raider, MD HISTORY FROM: patient  REASON FOR VISIT: new consult    HISTORICAL  CHIEF COMPLAINT:  Chief Complaint  Patient presents with   Seizure-like activity    Rm 7 New Pt  dgtr- Lafonda Mosses   "I had a severe migraine and kept throwing up, dgtr called 911, took me to hospital; have had diarrhea, tremors, vertigo since starting keppra"     HISTORY OF PRESENT ILLNESS:   85 year old female here for evaluation of possible seizures.  Patient was admitted to the hospital on 08/13/2020 when she developed severe migraine at work.  She was able to drive herself home.  She had severe nausea vomiting.  When her daughter came to check on her she was noted to be confused, having abnormal speech.  She was taken to the hospital for evaluation.  Symptoms had temporarily resolved and then recurred during evaluation.  Possibility of partial seizures was raised.  She was started on antiseizure medication.  Stroke and medical work-up were completed.  Possibility of seizures versus mild cognitive impairment/dementia was also raised.  Patient was discharged on Keppra 500 mg twice a day.  After discharge patient having more problems with diarrhea, vertigo, fatigue issues.  Patient has history of migraine headaches for many years.  Normally she is able to manage these with over-the-counter medications and resting at home.  About 1 week ago patient was at home and felt vertigo attack, fell forward and struck her left forehead on a deck post.   REVIEW OF SYSTEMS: Full 14 system review of systems performed and negative with exception of: as per HPI.  ALLERGIES: Allergies  Allergen Reactions   Claritin-D 24 Hour [Loratadine-Pseudoephedrine Er] Nausea Only   Codeine Nausea Only    Other reaction(s): stomach upset   Darvon [Propoxyphene]     Other reaction(s): stomach upset   Fexofenadine     Other  reaction(s): nausea   Loratadine     Other reaction(s): nausea   Penicillin G     Other reaction(s): rash   Septra [Sulfamethoxazole-Trimethoprim]     Other reaction(s): severe vomiting    HOME MEDICATIONS: Outpatient Medications Prior to Visit  Medication Sig Dispense Refill   levETIRAcetam (KEPPRA) 500 MG tablet Take 1 tablet (500 mg total) by mouth 2 (two) times daily. 180 tablet 0   Multiple Vitamin (MULTIVITAMIN) tablet Take 1 tablet by mouth daily.     vitamin E 45 MG (100 UNITS) capsule Take 100 Units by mouth daily.     Cyanocobalamin (VITAMIN B12 PO) Take 1 tablet by mouth daily.     GLUCOSAMINE HCL PO Take 1 capsule by mouth daily.     Magnesium 200 MG TABS Take 200 mg by mouth daily.     ondansetron (ZOFRAN) 4 MG tablet Take 1 tablet (4 mg total) by mouth every 8 (eight) hours as needed for nausea or vomiting. 30 tablet 1   thiamine 100 MG tablet Take 1 tablet (100 mg total) by mouth daily. 90 tablet 1   Turmeric 500 MG CAPS Take 500 mg by mouth daily.     No facility-administered medications prior to visit.    PAST MEDICAL HISTORY: Past Medical History:  Diagnosis Date   Migraines     PAST SURGICAL HISTORY: Past Surgical History:  Procedure Laterality Date   CHOLECYSTECTOMY     TONSILLECTOMY      FAMILY HISTORY: Family History  Problem Relation Age of  Onset   Diabetes Mother     SOCIAL HISTORY: Social History   Socioeconomic History   Marital status: Widowed    Spouse name: Not on file   Number of children: 3   Years of education: Not on file   Highest education level: Bachelor's degree (e.g., BA, AB, BS)  Occupational History   Not on file  Tobacco Use   Smoking status: Never   Smokeless tobacco: Never  Substance and Sexual Activity   Alcohol use: Yes    Alcohol/week: 2.0 standard drinks    Types: 2 Glasses of wine per week    Comment: 09/16/20 none in 2 months   Drug use: Never   Sexual activity: Not on file  Other Topics Concern   Not on  file  Social History Narrative   09/16/20 lives with room mate   Social Determinants of Health   Financial Resource Strain: Not on file  Food Insecurity: Not on file  Transportation Needs: Not on file  Physical Activity: Not on file  Stress: Not on file  Social Connections: Not on file  Intimate Partner Violence: Not on file     PHYSICAL EXAM  GENERAL EXAM/CONSTITUTIONAL: Vitals:  Vitals:   09/16/20 1139  BP: 137/83  Pulse: 98  Weight: 159 lb 12.8 oz (72.5 kg)  Height: 5\' 7"  (1.702 m)   Body mass index is 25.03 kg/m. Wt Readings from Last 3 Encounters:  09/16/20 159 lb 12.8 oz (72.5 kg)  08/14/20 163 lb 5.8 oz (74.1 kg)   Patient is in no distress; well developed, nourished and groomed; neck is supple ECCHYMOSES ON LEFT PERIORBITAL REGION; BRUISING ON LEFT > RIGHT ARMS   CARDIOVASCULAR: Examination of carotid arteries is normal; no carotid bruits Regular rate and rhythm, no murmurs Examination of peripheral vascular system by observation and palpation is normal  EYES: Ophthalmoscopic exam of optic discs and posterior segments is normal; no papilledema or hemorrhages No results found.  MUSCULOSKELETAL: Gait, strength, tone, movements noted in Neurologic exam below  NEUROLOGIC: MENTAL STATUS:  No flowsheet data found. awake, alert, oriented to person, place and time recent and remote memory intact normal attention and concentration language fluent, comprehension intact, naming intact fund of knowledge appropriate  CRANIAL NERVE:  2nd - no papilledema on fundoscopic exam 2nd, 3rd, 4th, 6th - pupils equal and reactive to light, visual fields full to confrontation, extraocular muscles intact, no nystagmus 5th - facial sensation symmetric 7th - facial strength symmetric 8th - hearing intact 9th - palate elevates symmetrically, uvula midline 11th - shoulder shrug symmetric 12th - tongue protrusion midline  MOTOR:  normal bulk and tone, full strength in the  BUE, BLE  SENSORY:  normal and symmetric to light touch, temperature, vibration  COORDINATION:  finger-nose-finger, fine finger movements normal  REFLEXES:  deep tendon reflexes trace and symmetric  GAIT/STATION:  narrow based gait     DIAGNOSTIC DATA (LABS, IMAGING, TESTING) - I reviewed patient records, labs, notes, testing and imaging myself where available.  Lab Results  Component Value Date   WBC 8.3 08/15/2020   HGB 12.1 08/15/2020   HCT 37.5 08/17/2020   MCV 97.0 08/15/2020   PLT 305 08/15/2020      Component Value Date/Time   NA 135 08/20/2020 0111   K 4.1 08/20/2020 0111   CL 103 08/20/2020 0111   CO2 24 08/20/2020 0111   GLUCOSE 109 (H) 08/20/2020 0111   BUN 14 08/20/2020 0111   CREATININE 1.03 (H) 08/20/2020 0111  CALCIUM 8.8 (L) 08/20/2020 0111   PROT 7.0 08/13/2020 2306   ALBUMIN 3.9 08/13/2020 2306   AST 28 08/13/2020 2306   ALT 21 08/13/2020 2306   ALKPHOS 102 08/13/2020 2306   BILITOT 0.8 08/13/2020 2306   GFRNONAA 54 (L) 08/20/2020 0111   No results found for: CHOL, HDL, LDLCALC, LDLDIRECT, TRIG, CHOLHDL No results found for: YTKP5W Lab Results  Component Value Date   VITAMINB12 1,453 (H) 08/17/2020   Lab Results  Component Value Date   TSH 1.230 08/17/2020     08/13/20  CT HEAD IMPRESSION 1. No acute intracranial abnormality identified. 2. Aspects = 10. 3. Age-related cerebral atrophy with moderate chronic microvascular ischemic disease.   CTA HEAD AND NECK IMPRESSION: 1. Negative CTA for emergent large vessel occlusion. 2. Intracranial atherosclerotic disease with associated moderate left A2 and right P2 stenoses. No other hemodynamically significant or correctable stenosis. 3. Diffuse tortuosity of the major arterial vasculature of the head and neck, suggesting chronic underlying hypertension.   CT PERFUSION IMPRESSION: Negative CT perfusion. No evidence for acute core infarct or other perfusion abnormality.  08/16/20 MRI  brain  - No evidence of recent infarction, hemorrhage, or mass. No abnormal enhancement. - Moderate chronic microvascular ischemic changes. Small chronic cerebellar infarcts.  08/15/20 EEG - This study is suggestive of nonspecific cortical dysfunction in left frontotemporal region. Additionally there is evidence of mild diffuse encephalopathy, nonspecific etiology.No seizures or epileptiform discharges were seen throughout the recording.    ASSESSMENT AND PLAN  85 y.o. year old female here with:  Dx:  1. Slurred speech   2. Aphasia       PLAN:  TRANSIENT CONFUSION / APHASIA SPELLS (improved) - ddx: encephalopathy, migraine phenomenon, mild cognitive impairment, partial seizures (less likely)  - reduce levetiracetam to 500mg  at bedtime x 1 week; then stop (I do not think patient had seizures; also having some side effects from levetiracetam)  - safety / supervision issues reviewed  - daily physical activity / exercise (at least 15-30 minutes)  - eat more plants / vegetables  - increase social activities, brain stimulation, games, puzzles, hobbies, crafts, arts, music  - aim for at least 7-8 hours sleep per night (or more)  - avoid smoking and alcohol  - caregiver resources provided  - caution with medications, finances; no driving   MIGRAINE WITH AURA - ibuprofen as needed   Return in about 3 months (around 12/17/2020).    02/16/2021, MD 09/16/2020, 6:00 PM Certified in Neurology, Neurophysiology and Neuroimaging  [facility] [address] [place], [place] [phone]

## 2020-09-16 NOTE — Patient Instructions (Signed)
-   reduce levetiracetam to 500mg  at bedtime x 1 week; then stop  - safety / supervision issues reviewed  - daily physical activity / exercise (at least 15-30 minutes)  - eat more plants / vegetables  - increase social activities, brain stimulation, games, puzzles, hobbies, crafts, arts, music  - aim for at least 7-8 hours sleep per night (or more)  - avoid smoking and alcohol  - caregiver resources provided  - caution with medications, finances; no driving

## 2020-09-18 DIAGNOSIS — Z1231 Encounter for screening mammogram for malignant neoplasm of breast: Secondary | ICD-10-CM | POA: Diagnosis not present

## 2020-09-18 DIAGNOSIS — Z780 Asymptomatic menopausal state: Secondary | ICD-10-CM | POA: Diagnosis not present

## 2020-09-25 DIAGNOSIS — S0012XA Contusion of left eyelid and periocular area, initial encounter: Secondary | ICD-10-CM | POA: Diagnosis not present

## 2020-10-07 ENCOUNTER — Ambulatory Visit: Payer: PPO | Admitting: Neurology

## 2020-10-24 ENCOUNTER — Encounter: Payer: Self-pay | Admitting: Diagnostic Neuroimaging

## 2020-10-24 ENCOUNTER — Telehealth: Payer: Self-pay | Admitting: Diagnostic Neuroimaging

## 2020-10-24 NOTE — Telephone Encounter (Signed)
Called daughter who stated she's not seen mom today but will call her. She stated patient had a bad headache on Mon without vomiting, did vomit Wed night and Thurs afternoon. Mom has denied headaches since but will put her hand on her forehead at times.  Graciella Belton stated she will monitor her mom, let us know if headaches come more frequently. I advised she may take Ibuprofen as needed. Advised if vomiting continues and not related to headaches she needs to let PCP know. Dianne verbalized understanding, appreciation.

## 2020-10-24 NOTE — Telephone Encounter (Signed)
Pt's daughter Carla White(DPR) called stating her mother had a really bad headache Monday pretty much laid around all day. Then started having issues that Wednesday. Her attention span is very short and starting to forget little things. Such as shutting the car door when getting out, also having to repeat things for her to understand. Pt's daughter is requesting a call back.

## 2020-11-20 DIAGNOSIS — Z9842 Cataract extraction status, left eye: Secondary | ICD-10-CM | POA: Diagnosis not present

## 2020-11-20 DIAGNOSIS — H5212 Myopia, left eye: Secondary | ICD-10-CM | POA: Diagnosis not present

## 2020-11-20 DIAGNOSIS — Z9841 Cataract extraction status, right eye: Secondary | ICD-10-CM | POA: Diagnosis not present

## 2020-11-20 DIAGNOSIS — H353132 Nonexudative age-related macular degeneration, bilateral, intermediate dry stage: Secondary | ICD-10-CM | POA: Diagnosis not present

## 2020-11-20 DIAGNOSIS — H26491 Other secondary cataract, right eye: Secondary | ICD-10-CM | POA: Diagnosis not present

## 2020-11-20 DIAGNOSIS — H52213 Irregular astigmatism, bilateral: Secondary | ICD-10-CM | POA: Diagnosis not present

## 2020-11-28 DIAGNOSIS — M25551 Pain in right hip: Secondary | ICD-10-CM | POA: Diagnosis not present

## 2020-12-16 ENCOUNTER — Ambulatory Visit: Payer: PPO | Admitting: Diagnostic Neuroimaging

## 2020-12-22 ENCOUNTER — Ambulatory Visit: Payer: PPO | Admitting: Diagnostic Neuroimaging

## 2020-12-23 ENCOUNTER — Telehealth: Payer: Self-pay | Admitting: Neurology

## 2020-12-23 NOTE — Telephone Encounter (Signed)
I called the pt's daughter back. She reports pt is leaving to go out of town for two weeks on 01/08/2021 and would like a sooner appt if possible to review changes in memory.  I advised I would add her to the wait list and see if we could get her worked in sooner.

## 2020-12-23 NOTE — Telephone Encounter (Signed)
Pt's daughter Carla White called wanting to know if her mother could be seen sooner. Mother will be traveling to [place] by plane on 01/08/2021. Daughter has some concern with her mothers memory, states she is declining. Ps daughter is requesting a call back.

## 2020-12-25 NOTE — Telephone Encounter (Signed)
I called the pt and the pt's daughter and offered 12/30/2020 appt at 130 pm for f/u appt with Dr. Drema Balzarine. Pt accepted and left vm for pt's daughter with the details of the appt. Daughter advised to CB if this time/date does not work for her.

## 2020-12-30 ENCOUNTER — Other Ambulatory Visit: Payer: Self-pay

## 2020-12-30 ENCOUNTER — Encounter: Payer: Self-pay | Admitting: Diagnostic Neuroimaging

## 2020-12-30 ENCOUNTER — Ambulatory Visit: Payer: PPO | Admitting: Diagnostic Neuroimaging

## 2020-12-30 VITALS — BP 122/82 | HR 107 | Ht 67.0 in | Wt 155.0 lb

## 2020-12-30 DIAGNOSIS — G43109 Migraine with aura, not intractable, without status migrainosus: Secondary | ICD-10-CM

## 2020-12-30 DIAGNOSIS — R4781 Slurred speech: Secondary | ICD-10-CM | POA: Diagnosis not present

## 2020-12-30 NOTE — Progress Notes (Signed)
[facility]  PATIENT: Carla White DOB: [birth year]  REFERRING CLINICIAN: Lupita Raider, MD HISTORY FROM: patient  REASON FOR VISIT: follow up   HISTORICAL  CHIEF COMPLAINT:  Chief Complaint  Patient presents with   Follow-up    Rm 7 with daughter- Pt reports speech has improved since her last visit. Daughter reports on h/a event since last visit that took place back in August.     HISTORY OF PRESENT ILLNESS:   UPDATE (12/30/20, VRP): Since last visit, doing well, except had bad headache in August 2022 x 1 week. Had some confusion and memory issues during HA. Now resolved. Has history of migraine from childhood. Some intermittent short term memory issues are ongoing.  PRIOR HPI: 85 year old female here for evaluation of possible seizures.  Patient was admitted to the hospital on 08/13/2020 when she developed severe migraine at work.  She was able to drive herself home.  She had severe nausea vomiting.  When her daughter came to check on her she was noted to be confused, having abnormal speech.  She was taken to the hospital for evaluation.  Symptoms had temporarily resolved and then recurred during evaluation.  Possibility of partial seizures was raised.  She was started on antiseizure medication.  Stroke and medical work-up were completed.  Possibility of seizures versus mild cognitive impairment/dementia was also raised.  Patient was discharged on Keppra 500 mg twice a day.  After discharge patient having more problems with diarrhea, vertigo, fatigue issues.  Patient has history of migraine headaches for many years.  Normally she is able to manage these with over-the-counter medications and resting at home.  About 1 week ago patient was at home and felt vertigo attack, fell forward and struck her left forehead on a deck post.   REVIEW OF SYSTEMS: Full 14 system review of systems performed and negative with exception of: as per HPI.  ALLERGIES: Allergies   Allergen Reactions   Claritin-D 24 Hour [Loratadine-Pseudoephedrine Er] Nausea Only   Codeine Nausea Only    Other reaction(s): stomach upset   Darvon [Propoxyphene]     Other reaction(s): stomach upset   Fexofenadine     Other reaction(s): nausea   Loratadine     Other reaction(s): nausea   Penicillin G     Other reaction(s): rash   Septra [Sulfamethoxazole-Trimethoprim]     Other reaction(s): severe vomiting    HOME MEDICATIONS: Outpatient Medications Prior to Visit  Medication Sig Dispense Refill   Cyanocobalamin (VITAMIN B12 PO) Take 1 tablet by mouth daily.     Ferrous Sulfate (IRON PO) Take by mouth.     GLUCOSAMINE HCL PO Take 1 capsule by mouth daily.     levETIRAcetam (KEPPRA) 500 MG tablet Take 1 tablet (500 mg total) by mouth 2 (two) times daily. 180 tablet 0   Magnesium 200 MG TABS Take 200 mg by mouth daily.     Multiple Vitamin (MULTIVITAMIN) tablet Take 1 tablet by mouth daily.     ondansetron (ZOFRAN) 4 MG tablet Take 1 tablet (4 mg total) by mouth every 8 (eight) hours as needed for nausea or vomiting. 30 tablet 1   Probiotic Product (PROBIOTIC PO) Take by mouth.     thiamine 100 MG tablet Take 1 tablet (100 mg total) by mouth daily. 90 tablet 1   Turmeric 500 MG CAPS Take 500 mg by mouth daily.     vitamin E 45 MG (100 UNITS) capsule Take 100 Units by mouth daily.  No facility-administered medications prior to visit.    PAST MEDICAL HISTORY: Past Medical History:  Diagnosis Date   Migraines     PAST SURGICAL HISTORY: Past Surgical History:  Procedure Laterality Date   CHOLECYSTECTOMY     TONSILLECTOMY      FAMILY HISTORY: Family History  Problem Relation Age of Onset   Diabetes Mother     SOCIAL HISTORY: Social History   Socioeconomic History   Marital status: Widowed    Spouse name: Not on file   Number of children: 3   Years of education: Not on file   Highest education level: Bachelor's degree (e.g., BA, AB, BS)  Occupational  History   Not on file  Tobacco Use   Smoking status: Never   Smokeless tobacco: Never  Substance and Sexual Activity   Alcohol use: Yes    Alcohol/week: 2.0 standard drinks    Types: 2 Glasses of wine per week    Comment: 09/16/20 none in 2 months   Drug use: Never   Sexual activity: Not on file  Other Topics Concern   Not on file  Social History Narrative   09/16/20 lives at her home with room mate   Social Determinants of Health   Financial Resource Strain: Not on file  Food Insecurity: Not on file  Transportation Needs: Not on file  Physical Activity: Not on file  Stress: Not on file  Social Connections: Not on file  Intimate Partner Violence: Not on file     PHYSICAL EXAM  GENERAL EXAM/CONSTITUTIONAL: Vitals:  Vitals:   12/30/20 1338  BP: 122/82  Pulse: (!) 107  SpO2: 98%  Weight: 155 lb (70.3 kg)  Height: 5\' 7"  (1.702 m)   Body mass index is 24.28 kg/m. Wt Readings from Last 3 Encounters:  12/30/20 155 lb (70.3 kg)  09/16/20 159 lb 12.8 oz (72.5 kg)  08/14/20 163 lb 5.8 oz (74.1 kg)   Patient is in no distress; well developed, nourished and groomed; neck is supple  CARDIOVASCULAR: Examination of carotid arteries is normal; no carotid bruits Regular rate and rhythm, no murmurs Examination of peripheral vascular system by observation and palpation is normal  EYES: Ophthalmoscopic exam of optic discs and posterior segments is normal; no papilledema or hemorrhages No results found.  MUSCULOSKELETAL: Gait, strength, tone, movements noted in Neurologic exam below  NEUROLOGIC: MENTAL STATUS:  No flowsheet data found. awake, alert, oriented to person, place and time recent and remote memory intact normal attention and concentration language fluent, comprehension intact, naming intact fund of knowledge appropriate  CRANIAL NERVE:  2nd - no papilledema on fundoscopic exam 2nd, 3rd, 4th, 6th - pupils equal and reactive to light, visual fields full to  confrontation, extraocular muscles intact, no nystagmus 5th - facial sensation symmetric 7th - facial strength symmetric 8th - hearing intact 9th - palate elevates symmetrically, uvula midline 11th - shoulder shrug symmetric 12th - tongue protrusion midline  MOTOR:  normal bulk and tone, full strength in the BUE, BLE  SENSORY:  normal and symmetric to light touch, temperature, vibration  COORDINATION:  finger-nose-finger, fine finger movements normal  REFLEXES:  deep tendon reflexes trace and symmetric  GAIT/STATION:  narrow based gait     DIAGNOSTIC DATA (LABS, IMAGING, TESTING) - I reviewed patient records, labs, notes, testing and imaging myself where available.  Lab Results  Component Value Date   WBC 8.3 08/15/2020   HGB 12.1 08/15/2020   HCT 37.5 08/17/2020   MCV 97.0 08/15/2020  PLT 305 08/15/2020      Component Value Date/Time   NA 135 08/20/2020 0111   K 4.1 08/20/2020 0111   CL 103 08/20/2020 0111   CO2 24 08/20/2020 0111   GLUCOSE 109 (H) 08/20/2020 0111   BUN 14 08/20/2020 0111   CREATININE 1.03 (H) 08/20/2020 0111   CALCIUM 8.8 (L) 08/20/2020 0111   PROT 7.0 08/13/2020 2306   ALBUMIN 3.9 08/13/2020 2306   AST 28 08/13/2020 2306   ALT 21 08/13/2020 2306   ALKPHOS 102 08/13/2020 2306   BILITOT 0.8 08/13/2020 2306   GFRNONAA 54 (L) 08/20/2020 0111   No results found for: CHOL, HDL, LDLCALC, LDLDIRECT, TRIG, CHOLHDL No results found for: UVOZ3G Lab Results  Component Value Date   VITAMINB12 1,453 (H) 08/17/2020   Lab Results  Component Value Date   TSH 1.230 08/17/2020     08/13/20  CT HEAD IMPRESSION 1. No acute intracranial abnormality identified. 2. Aspects = 10. 3. Age-related cerebral atrophy with moderate chronic microvascular ischemic disease.   CTA HEAD AND NECK IMPRESSION: 1. Negative CTA for emergent large vessel occlusion. 2. Intracranial atherosclerotic disease with associated moderate left A2 and right P2 stenoses. No  other hemodynamically significant or correctable stenosis. 3. Diffuse tortuosity of the major arterial vasculature of the head and neck, suggesting chronic underlying hypertension.   CT PERFUSION IMPRESSION: Negative CT perfusion. No evidence for acute core infarct or other perfusion abnormality.  08/16/20 MRI brain  - No evidence of recent infarction, hemorrhage, or mass. No abnormal enhancement. - Moderate chronic microvascular ischemic changes. Small chronic cerebellar infarcts.  08/15/20 EEG - This study is suggestive of nonspecific cortical dysfunction in left frontotemporal region. Additionally there is evidence of mild diffuse encephalopathy, nonspecific etiology.No seizures or epileptiform discharges were seen throughout the recording.    ASSESSMENT AND PLAN  85 y.o. year old female here with:  Dx:  1. Migraine with aura and without status migrainosus, not intractable   2. Slurred speech      PLAN:  SHORT TERM MEMORY LOSS / TRANSIENT CONFUSION / APHASIA SPELLS - likely migraine phenomenon + mild cognitive impairment  - safety / supervision issues reviewed  - daily physical activity / exercise (at least 15-30 minutes)  - eat more plants / vegetables  - increase social activities, brain stimulation, games, puzzles, hobbies, crafts, arts, music  - aim for at least 7-8 hours sleep per night (or more)  - avoid smoking and alcohol  - caregiver resources provided  - caution with medications, finances; no driving   MIGRAINE WITH AURA - ibuprofen as needed   Return for return to PCP, pending if symptoms worsen or fail to improve.    Suanne Marker, MD 12/30/2020, 1:53 PM Certified in Neurology, Neurophysiology and Neuroimaging  [facility] [address] [place], [place] [address] [phone]

## 2021-02-03 ENCOUNTER — Ambulatory Visit: Payer: PPO | Admitting: Diagnostic Neuroimaging

## 2021-03-05 DIAGNOSIS — R4189 Other symptoms and signs involving cognitive functions and awareness: Secondary | ICD-10-CM | POA: Diagnosis not present

## 2021-03-05 DIAGNOSIS — R200 Anesthesia of skin: Secondary | ICD-10-CM | POA: Diagnosis not present

## 2021-03-05 DIAGNOSIS — R202 Paresthesia of skin: Secondary | ICD-10-CM | POA: Diagnosis not present

## 2021-03-05 DIAGNOSIS — R296 Repeated falls: Secondary | ICD-10-CM | POA: Diagnosis not present

## 2021-03-17 DIAGNOSIS — R251 Tremor, unspecified: Secondary | ICD-10-CM | POA: Diagnosis not present

## 2021-03-17 DIAGNOSIS — R202 Paresthesia of skin: Secondary | ICD-10-CM | POA: Diagnosis not present

## 2021-03-17 DIAGNOSIS — R413 Other amnesia: Secondary | ICD-10-CM | POA: Diagnosis not present

## 2021-03-17 DIAGNOSIS — R200 Anesthesia of skin: Secondary | ICD-10-CM | POA: Diagnosis not present

## 2021-03-17 DIAGNOSIS — R4189 Other symptoms and signs involving cognitive functions and awareness: Secondary | ICD-10-CM | POA: Diagnosis not present

## 2021-03-17 DIAGNOSIS — R296 Repeated falls: Secondary | ICD-10-CM | POA: Diagnosis not present

## 2021-03-17 DIAGNOSIS — G479 Sleep disorder, unspecified: Secondary | ICD-10-CM | POA: Diagnosis not present

## 2021-03-17 DIAGNOSIS — R4789 Other speech disturbances: Secondary | ICD-10-CM | POA: Diagnosis not present

## 2021-03-17 DIAGNOSIS — R29898 Other symptoms and signs involving the musculoskeletal system: Secondary | ICD-10-CM | POA: Diagnosis not present

## 2021-03-18 ENCOUNTER — Other Ambulatory Visit: Payer: Self-pay | Admitting: Physician Assistant

## 2021-03-18 DIAGNOSIS — R413 Other amnesia: Secondary | ICD-10-CM

## 2021-03-18 DIAGNOSIS — R4781 Slurred speech: Secondary | ICD-10-CM

## 2021-03-22 ENCOUNTER — Ambulatory Visit
Admission: RE | Admit: 2021-03-22 | Discharge: 2021-03-22 | Disposition: A | Discharge status: Home / Self Care | Payer: PPO | Source: Ambulatory Visit | Attending: Physician Assistant | Admitting: Physician Assistant

## 2021-03-22 DIAGNOSIS — G43909 Migraine, unspecified, not intractable, without status migrainosus: Secondary | ICD-10-CM | POA: Diagnosis not present

## 2021-03-22 DIAGNOSIS — R4781 Slurred speech: Secondary | ICD-10-CM | POA: Insufficient documentation

## 2021-03-22 DIAGNOSIS — G319 Degenerative disease of nervous system, unspecified: Secondary | ICD-10-CM | POA: Diagnosis not present

## 2021-03-22 DIAGNOSIS — R413 Other amnesia: Secondary | ICD-10-CM | POA: Insufficient documentation

## 2021-03-22 IMAGING — MR MR HEAD W/O CM
13 series · 48 of 48 positions shown · non-contrast
Comparison: Brain MRI [DATE].

CLINICAL DATA: Provided history: Memory difficulties. Slurred
speech. Additional history provided by scanning technologist:
Patient reports fall (with head injury) in [DATE]. Fall in
[DATE]. Occasional migraines. Memory problems.

EXAM:
MRI HEAD WITHOUT CONTRAST
TECHNIQUE: Multiplanar, multiecho pulse sequences of the brain and surrounding
structures were obtained without intravenous contrast.

[Series 5: ax dwi_tracew · axial · 3.0mm · 0.65mm/px · z∈[-65,+88]mm · 2 of 48 slices shown]
[im 1/48]
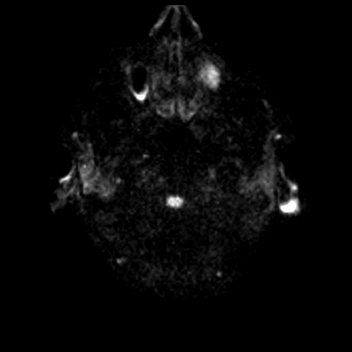
[im 48/48]
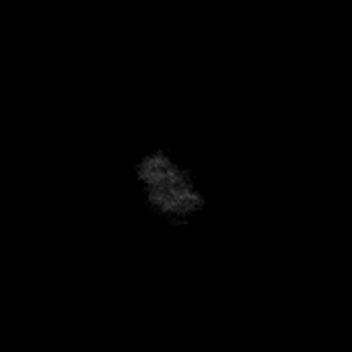

[Series 6: ax dwi_adc · axial · 3.0mm · 0.65mm/px · z∈[-65,+88]mm · 3 of 48 slices shown]
[im 1/48]
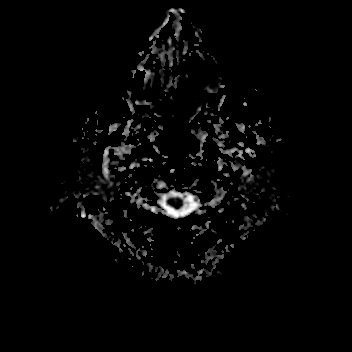
[im 24/48]
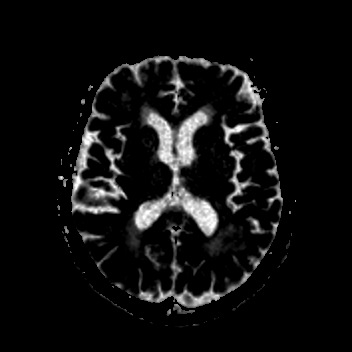
[im 48/48]
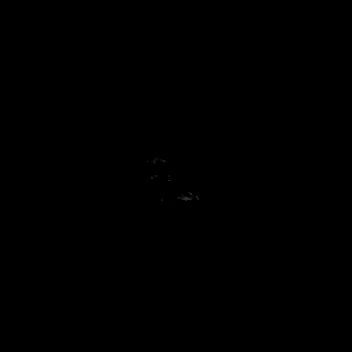

[Series 7: cor dwi_tracew · coronal · 5.0mm · 0.68mm/px · 3 of 40 slices shown]
[im 1/40]
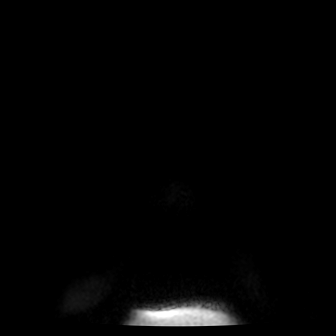
[im 20/40]
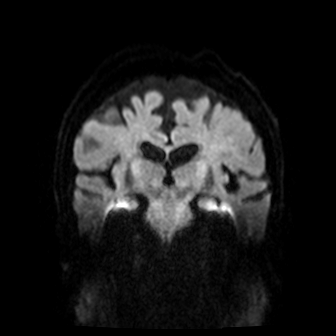
[im 40/40]
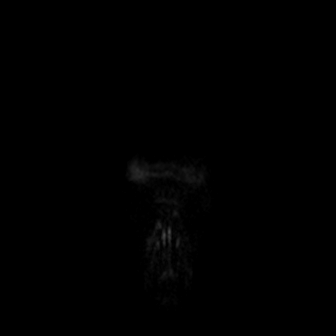

[Series 8: cor dwi_adc · coronal · 5.0mm · 0.68mm/px · 3 of 39 slices shown]
[im 1/39]
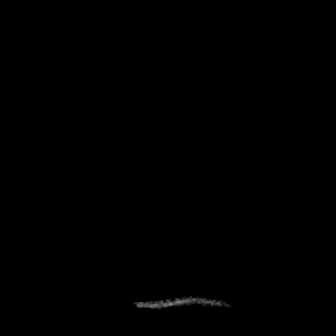
[im 20/39]
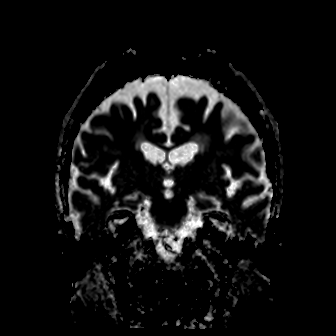
[im 39/39]
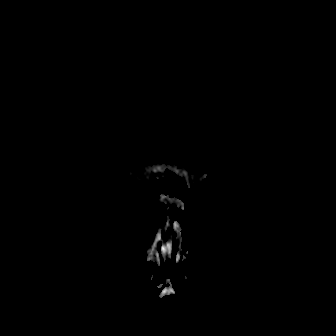

[Series 9: T1 · sagittal · 5.0mm · 0.62mm/px · 2 of 25 slices shown (1 of 2)]
[im 1/25]
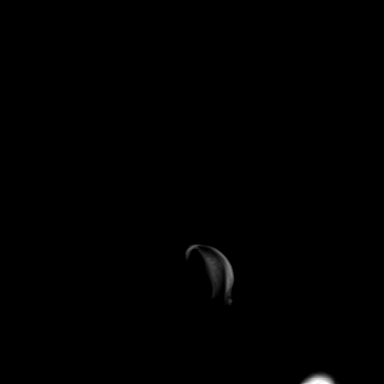
[im 25/25]
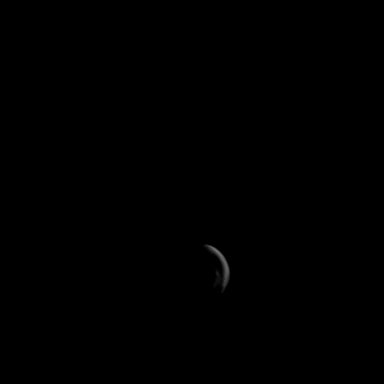

[Series 10: T2 · axial · 5.0mm · 0.53mm/px · z∈[-61,+81]mm · 2 of 25 slices shown (1 of 2)]
[im 1/25]
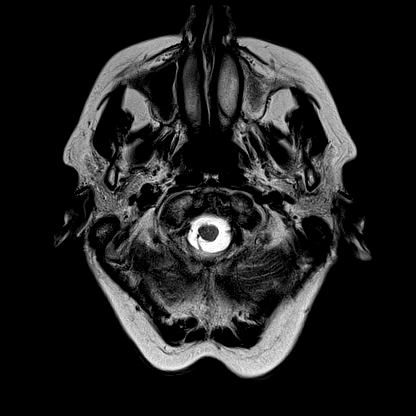
[im 25/25]
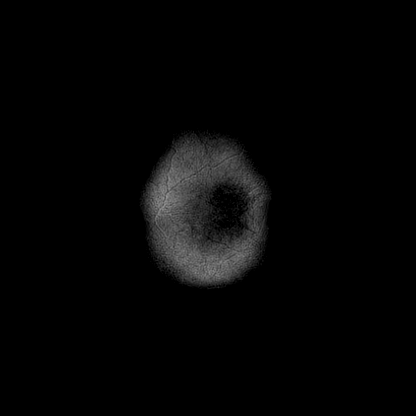

[Series 11: mag_images · axial · 3.0mm · 0.90mm/px · z∈[-77,+98]mm · 4 of 60 slices shown]
[im 1/60]
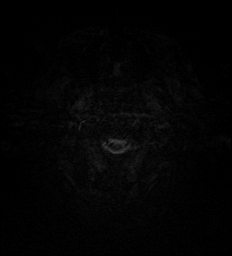
[im 20/60]
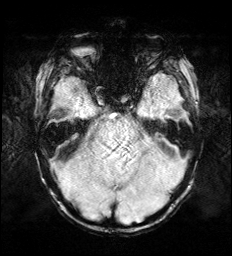
[im 40/60]
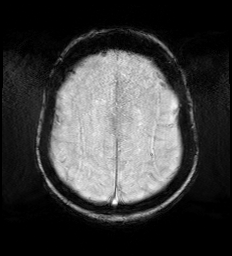
[im 60/60]
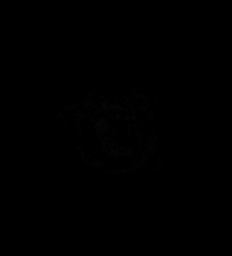

[Series 12: pha_images · axial · 3.0mm · 0.90mm/px · z∈[-77,+95]mm · 4 of 59 slices shown]
[im 1/59]
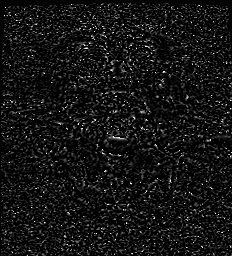
[im 20/59]
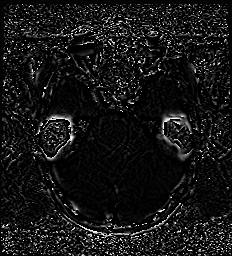
[im 39/59]
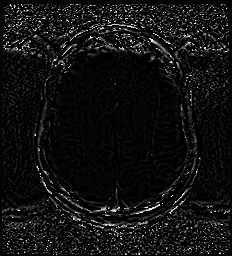
[im 59/59]
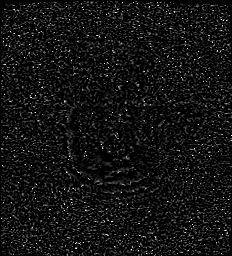

[Series 13: swi_images · axial · 3.0mm · 0.90mm/px · z∈[-77,+98]mm · 4 of 60 slices shown]
[im 1/60]
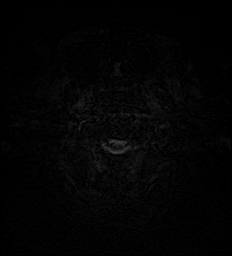
[im 20/60]
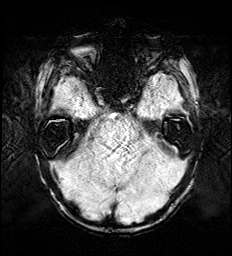
[im 40/60]
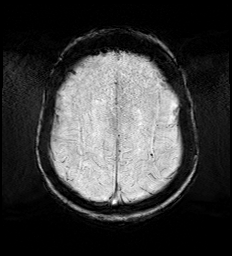
[im 60/60]
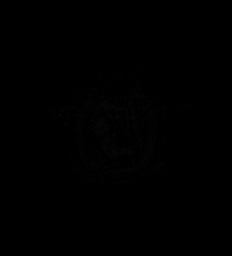

[Series 14: mip_images(sw) · axial · 24.0mm · 0.90mm/px · z∈[-67,+87]mm · 4 of 53 slices shown]
[im 1/53]
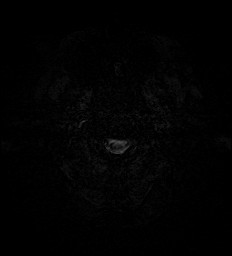
[im 18/53]
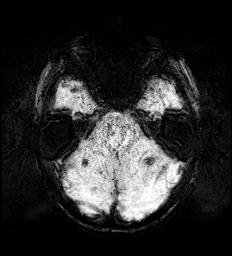
[im 35/53]
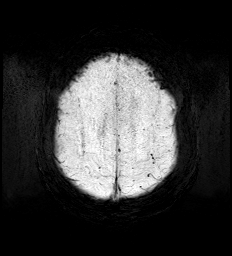
[im 53/53]
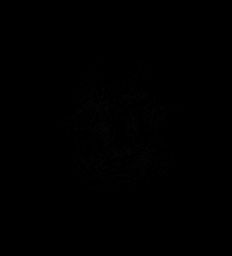

[Series 15: FLAIR · axial · 3.0mm · 0.53mm/px · z∈[-70,+90]mm · 4 of 55 slices shown]
[im 1/55]
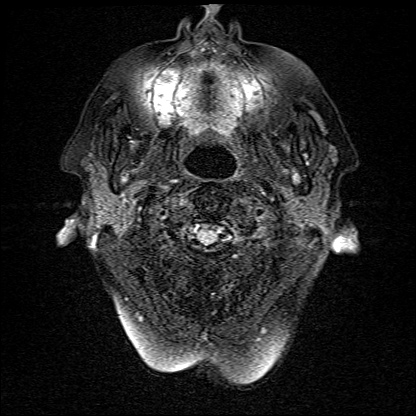
[im 19/55]
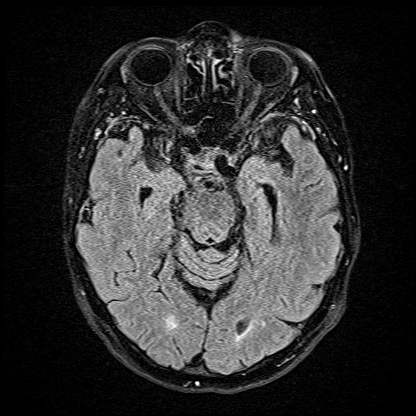
[im 37/55]
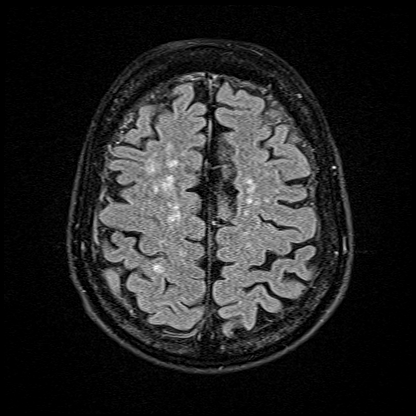
[im 55/55]
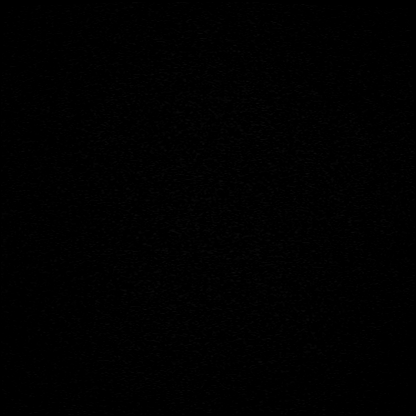

[Series 16: T1 · axial · 1.0mm · 0.98mm/px · z∈[-66,+91]mm · 11 of 160 slices shown (2 of 2)]
[im 1/160]
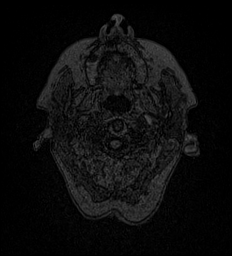
[im 16/160]
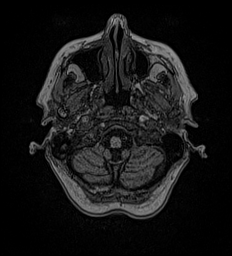
[im 32/160]
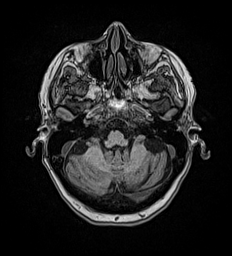
[im 48/160]
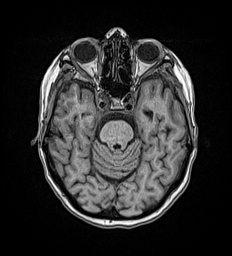
[im 64/160]
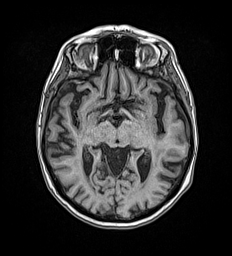
[im 80/160]
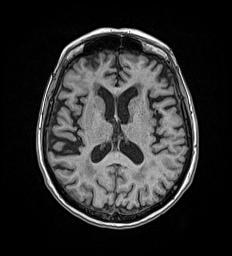
[im 96/160]
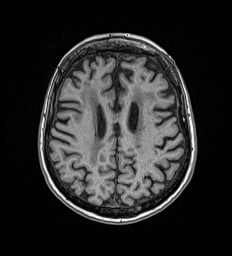
[im 112/160]
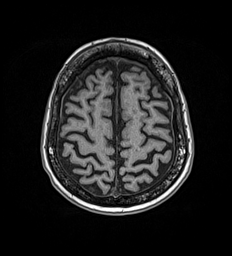
[im 128/160]
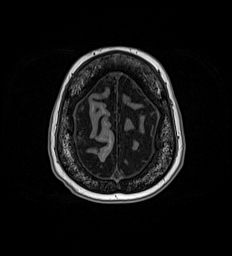
[im 144/160]
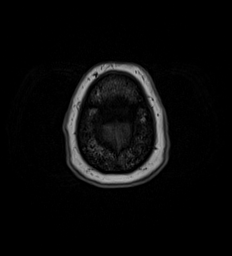
[im 160/160]
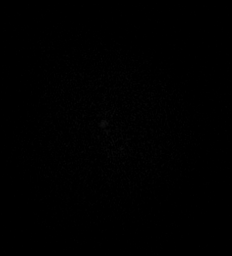

[Series 17: T2 · coronal · 5.0mm · 0.57mm/px · 2 of 29 slices shown (2 of 2)]
[im 1/29]
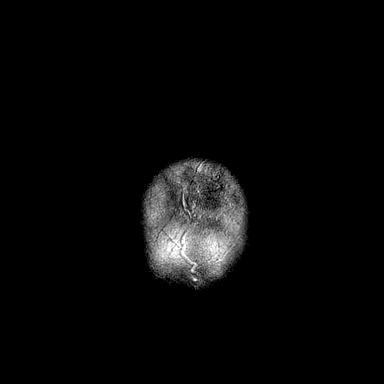
[im 29/29]
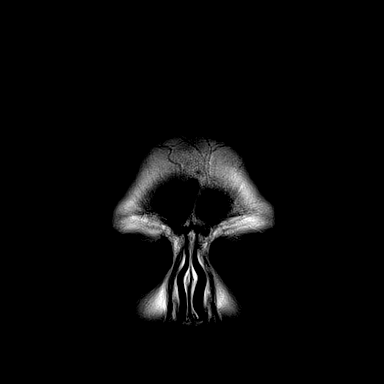

[48 of 48 positions shown; findings below may reference images not displayed]

FINDINGS: Brain:

Mild generalized cerebral and cerebellar atrophy.

Moderate/advanced multifocal T2 FLAIR hyperintense signal
abnormality within the cerebral white matter, nonspecific though
compatible chronic small vessel ischemic disease.

Punctate chronic microhemorrhage within the posterior right frontal
lobe (series 13, image 46). This is not definitely present on the
prior MRI of [DATE].

As before, there are small chronic infarcts within the bilateral
cerebellar hemispheres.

There is no acute infarct.

No evidence of an intracranial mass.

No extra-axial fluid collection.

No midline shift.

Vascular: Maintained flow voids within the proximal large arterial
vessels.

Skull and upper cervical spine: No focal suspicious marrow lesion.

Sinuses/Orbits: Visualized orbits show no acute finding. No
significant paranasal sinus disease.

Other: Trace fluid within left mastoid air cells.
IMPRESSION: No evidence of acute intracranial abnormality.

Moderate/advanced chronic small vessel ischemic changes within the
cerebral white matter, stable from the prior MRI of [DATE].

Redemonstrated small chronic infarcts within the bilateral
cerebellar hemispheres.

Mild generalized parenchymal atrophy.

Trace fluid within the left mastoid air cells.

## 2021-06-16 DIAGNOSIS — R200 Anesthesia of skin: Secondary | ICD-10-CM | POA: Diagnosis not present

## 2021-06-18 DIAGNOSIS — R413 Other amnesia: Secondary | ICD-10-CM | POA: Diagnosis not present

## 2021-06-18 DIAGNOSIS — R296 Repeated falls: Secondary | ICD-10-CM | POA: Diagnosis not present

## 2021-06-18 DIAGNOSIS — R29898 Other symptoms and signs involving the musculoskeletal system: Secondary | ICD-10-CM | POA: Diagnosis not present

## 2021-06-18 DIAGNOSIS — R569 Unspecified convulsions: Secondary | ICD-10-CM | POA: Diagnosis not present

## 2021-06-18 DIAGNOSIS — R251 Tremor, unspecified: Secondary | ICD-10-CM | POA: Diagnosis not present

## 2021-06-18 DIAGNOSIS — R4781 Slurred speech: Secondary | ICD-10-CM | POA: Diagnosis not present

## 2021-06-18 DIAGNOSIS — R202 Paresthesia of skin: Secondary | ICD-10-CM | POA: Diagnosis not present

## 2021-06-18 DIAGNOSIS — R4189 Other symptoms and signs involving cognitive functions and awareness: Secondary | ICD-10-CM | POA: Diagnosis not present

## 2021-06-18 DIAGNOSIS — R200 Anesthesia of skin: Secondary | ICD-10-CM | POA: Diagnosis not present

## 2021-06-22 ENCOUNTER — Other Ambulatory Visit: Payer: Self-pay | Admitting: Physician Assistant

## 2021-06-22 DIAGNOSIS — G8929 Other chronic pain: Secondary | ICD-10-CM

## 2021-08-14 DIAGNOSIS — G629 Polyneuropathy, unspecified: Secondary | ICD-10-CM | POA: Diagnosis not present

## 2021-08-14 DIAGNOSIS — J301 Allergic rhinitis due to pollen: Secondary | ICD-10-CM | POA: Diagnosis not present

## 2021-08-14 DIAGNOSIS — Z79899 Other long term (current) drug therapy: Secondary | ICD-10-CM | POA: Diagnosis not present

## 2021-08-14 DIAGNOSIS — E559 Vitamin D deficiency, unspecified: Secondary | ICD-10-CM | POA: Diagnosis not present

## 2021-08-14 DIAGNOSIS — Z0000 Encounter for general adult medical examination without abnormal findings: Secondary | ICD-10-CM | POA: Diagnosis not present

## 2021-08-20 DIAGNOSIS — R29898 Other symptoms and signs involving the musculoskeletal system: Secondary | ICD-10-CM | POA: Diagnosis not present

## 2021-08-20 DIAGNOSIS — R200 Anesthesia of skin: Secondary | ICD-10-CM | POA: Diagnosis not present

## 2021-08-20 DIAGNOSIS — R251 Tremor, unspecified: Secondary | ICD-10-CM | POA: Diagnosis not present

## 2021-08-20 DIAGNOSIS — G8929 Other chronic pain: Secondary | ICD-10-CM | POA: Diagnosis not present

## 2021-08-20 DIAGNOSIS — R4189 Other symptoms and signs involving cognitive functions and awareness: Secondary | ICD-10-CM | POA: Diagnosis not present

## 2021-08-20 DIAGNOSIS — R413 Other amnesia: Secondary | ICD-10-CM | POA: Diagnosis not present

## 2021-08-20 DIAGNOSIS — M545 Low back pain, unspecified: Secondary | ICD-10-CM | POA: Diagnosis not present

## 2021-08-20 DIAGNOSIS — R202 Paresthesia of skin: Secondary | ICD-10-CM | POA: Diagnosis not present

## 2021-09-02 DIAGNOSIS — N179 Acute kidney failure, unspecified: Secondary | ICD-10-CM | POA: Diagnosis not present

## 2021-11-09 DIAGNOSIS — R21 Rash and other nonspecific skin eruption: Secondary | ICD-10-CM | POA: Diagnosis not present

## 2021-11-18 DIAGNOSIS — L239 Allergic contact dermatitis, unspecified cause: Secondary | ICD-10-CM | POA: Diagnosis not present

## 2021-11-18 DIAGNOSIS — G629 Polyneuropathy, unspecified: Secondary | ICD-10-CM | POA: Diagnosis not present

## 2021-11-27 DIAGNOSIS — H353132 Nonexudative age-related macular degeneration, bilateral, intermediate dry stage: Secondary | ICD-10-CM | POA: Diagnosis not present

## 2021-11-27 DIAGNOSIS — H26491 Other secondary cataract, right eye: Secondary | ICD-10-CM | POA: Diagnosis not present

## 2021-11-27 DIAGNOSIS — Z9842 Cataract extraction status, left eye: Secondary | ICD-10-CM | POA: Diagnosis not present

## 2021-11-27 DIAGNOSIS — Z9841 Cataract extraction status, right eye: Secondary | ICD-10-CM | POA: Diagnosis not present

## 2021-11-27 DIAGNOSIS — H52213 Irregular astigmatism, bilateral: Secondary | ICD-10-CM | POA: Diagnosis not present

## 2022-01-05 DIAGNOSIS — R0981 Nasal congestion: Secondary | ICD-10-CM | POA: Diagnosis not present

## 2022-01-05 DIAGNOSIS — R059 Cough, unspecified: Secondary | ICD-10-CM | POA: Diagnosis not present

## 2022-01-05 DIAGNOSIS — R54 Age-related physical debility: Secondary | ICD-10-CM | POA: Diagnosis not present

## 2022-01-05 DIAGNOSIS — U071 COVID-19: Secondary | ICD-10-CM | POA: Diagnosis not present

## 2022-02-19 DIAGNOSIS — M545 Low back pain, unspecified: Secondary | ICD-10-CM | POA: Diagnosis not present

## 2022-02-19 DIAGNOSIS — R29898 Other symptoms and signs involving the musculoskeletal system: Secondary | ICD-10-CM | POA: Diagnosis not present

## 2022-02-19 DIAGNOSIS — R569 Unspecified convulsions: Secondary | ICD-10-CM | POA: Diagnosis not present

## 2022-02-19 DIAGNOSIS — R413 Other amnesia: Secondary | ICD-10-CM | POA: Diagnosis not present

## 2022-02-19 DIAGNOSIS — R200 Anesthesia of skin: Secondary | ICD-10-CM | POA: Diagnosis not present

## 2022-02-19 DIAGNOSIS — G8929 Other chronic pain: Secondary | ICD-10-CM | POA: Diagnosis not present

## 2022-07-01 DIAGNOSIS — W57XXXA Bitten or stung by nonvenomous insect and other nonvenomous arthropods, initial encounter: Secondary | ICD-10-CM | POA: Diagnosis not present

## 2022-07-01 DIAGNOSIS — L089 Local infection of the skin and subcutaneous tissue, unspecified: Secondary | ICD-10-CM | POA: Diagnosis not present

## 2022-07-01 DIAGNOSIS — S40862A Insect bite (nonvenomous) of left upper arm, initial encounter: Secondary | ICD-10-CM | POA: Diagnosis not present

## 2022-08-20 DIAGNOSIS — R200 Anesthesia of skin: Secondary | ICD-10-CM | POA: Diagnosis not present

## 2022-08-20 DIAGNOSIS — R413 Other amnesia: Secondary | ICD-10-CM | POA: Diagnosis not present

## 2022-08-20 DIAGNOSIS — G250 Essential tremor: Secondary | ICD-10-CM | POA: Diagnosis not present

## 2022-08-20 DIAGNOSIS — R202 Paresthesia of skin: Secondary | ICD-10-CM | POA: Diagnosis not present

## 2022-09-01 DIAGNOSIS — J301 Allergic rhinitis due to pollen: Secondary | ICD-10-CM | POA: Diagnosis not present

## 2022-09-01 DIAGNOSIS — Z9989 Dependence on other enabling machines and devices: Secondary | ICD-10-CM | POA: Diagnosis not present

## 2022-09-01 DIAGNOSIS — Z0000 Encounter for general adult medical examination without abnormal findings: Secondary | ICD-10-CM | POA: Diagnosis not present

## 2022-09-01 DIAGNOSIS — G629 Polyneuropathy, unspecified: Secondary | ICD-10-CM | POA: Diagnosis not present

## 2022-09-01 DIAGNOSIS — N183 Chronic kidney disease, stage 3 unspecified: Secondary | ICD-10-CM | POA: Diagnosis not present

## 2022-09-01 DIAGNOSIS — E559 Vitamin D deficiency, unspecified: Secondary | ICD-10-CM | POA: Diagnosis not present

## 2022-09-01 DIAGNOSIS — G40909 Epilepsy, unspecified, not intractable, without status epilepticus: Secondary | ICD-10-CM | POA: Diagnosis not present

## 2022-09-01 DIAGNOSIS — Z79899 Other long term (current) drug therapy: Secondary | ICD-10-CM | POA: Diagnosis not present

## 2022-12-06 DIAGNOSIS — H5212 Myopia, left eye: Secondary | ICD-10-CM | POA: Diagnosis not present

## 2022-12-06 DIAGNOSIS — Z9841 Cataract extraction status, right eye: Secondary | ICD-10-CM | POA: Diagnosis not present

## 2022-12-06 DIAGNOSIS — H353132 Nonexudative age-related macular degeneration, bilateral, intermediate dry stage: Secondary | ICD-10-CM | POA: Diagnosis not present

## 2022-12-06 DIAGNOSIS — H52213 Irregular astigmatism, bilateral: Secondary | ICD-10-CM | POA: Diagnosis not present

## 2022-12-06 DIAGNOSIS — H26493 Other secondary cataract, bilateral: Secondary | ICD-10-CM | POA: Diagnosis not present

## 2022-12-06 DIAGNOSIS — Z9842 Cataract extraction status, left eye: Secondary | ICD-10-CM | POA: Diagnosis not present

## 2023-02-18 DIAGNOSIS — R200 Anesthesia of skin: Secondary | ICD-10-CM | POA: Diagnosis not present

## 2023-02-18 DIAGNOSIS — R413 Other amnesia: Secondary | ICD-10-CM | POA: Diagnosis not present

## 2023-02-18 DIAGNOSIS — R202 Paresthesia of skin: Secondary | ICD-10-CM | POA: Diagnosis not present

## 2023-02-18 DIAGNOSIS — G250 Essential tremor: Secondary | ICD-10-CM | POA: Diagnosis not present

## 2023-02-18 DIAGNOSIS — G8929 Other chronic pain: Secondary | ICD-10-CM | POA: Diagnosis not present

## 2023-02-18 DIAGNOSIS — R569 Unspecified convulsions: Secondary | ICD-10-CM | POA: Diagnosis not present

## 2023-02-18 DIAGNOSIS — M545 Low back pain, unspecified: Secondary | ICD-10-CM | POA: Diagnosis not present

## 2023-08-06 DIAGNOSIS — N183 Chronic kidney disease, stage 3 unspecified: Secondary | ICD-10-CM | POA: Diagnosis not present

## 2023-09-02 DIAGNOSIS — Z1331 Encounter for screening for depression: Secondary | ICD-10-CM | POA: Diagnosis not present

## 2023-09-02 DIAGNOSIS — Z79899 Other long term (current) drug therapy: Secondary | ICD-10-CM | POA: Diagnosis not present

## 2023-09-02 DIAGNOSIS — N183 Chronic kidney disease, stage 3 unspecified: Secondary | ICD-10-CM | POA: Diagnosis not present

## 2023-09-02 DIAGNOSIS — E559 Vitamin D deficiency, unspecified: Secondary | ICD-10-CM | POA: Diagnosis not present

## 2023-09-02 DIAGNOSIS — J301 Allergic rhinitis due to pollen: Secondary | ICD-10-CM | POA: Diagnosis not present

## 2023-09-02 DIAGNOSIS — J3489 Other specified disorders of nose and nasal sinuses: Secondary | ICD-10-CM | POA: Diagnosis not present

## 2023-09-02 DIAGNOSIS — G40909 Epilepsy, unspecified, not intractable, without status epilepticus: Secondary | ICD-10-CM | POA: Diagnosis not present

## 2023-09-02 DIAGNOSIS — G629 Polyneuropathy, unspecified: Secondary | ICD-10-CM | POA: Diagnosis not present

## 2023-09-02 DIAGNOSIS — Z0000 Encounter for general adult medical examination without abnormal findings: Secondary | ICD-10-CM | POA: Diagnosis not present

## 2023-12-13 DIAGNOSIS — Z9841 Cataract extraction status, right eye: Secondary | ICD-10-CM | POA: Diagnosis not present

## 2023-12-13 DIAGNOSIS — H52213 Irregular astigmatism, bilateral: Secondary | ICD-10-CM | POA: Diagnosis not present

## 2023-12-13 DIAGNOSIS — H353132 Nonexudative age-related macular degeneration, bilateral, intermediate dry stage: Secondary | ICD-10-CM | POA: Diagnosis not present

## 2023-12-13 DIAGNOSIS — Z9842 Cataract extraction status, left eye: Secondary | ICD-10-CM | POA: Diagnosis not present

## 2023-12-13 DIAGNOSIS — H5212 Myopia, left eye: Secondary | ICD-10-CM | POA: Diagnosis not present

## 2023-12-13 DIAGNOSIS — H26493 Other secondary cataract, bilateral: Secondary | ICD-10-CM | POA: Diagnosis not present

## 2023-12-30 DIAGNOSIS — D649 Anemia, unspecified: Secondary | ICD-10-CM | POA: Diagnosis not present

## 2023-12-30 DIAGNOSIS — L309 Dermatitis, unspecified: Secondary | ICD-10-CM | POA: Diagnosis not present

## 2023-12-30 DIAGNOSIS — G40909 Epilepsy, unspecified, not intractable, without status epilepticus: Secondary | ICD-10-CM | POA: Diagnosis not present

## 2023-12-30 DIAGNOSIS — R4189 Other symptoms and signs involving cognitive functions and awareness: Secondary | ICD-10-CM | POA: Diagnosis not present

## 2023-12-30 DIAGNOSIS — G629 Polyneuropathy, unspecified: Secondary | ICD-10-CM | POA: Diagnosis not present

## 2024-01-23 DIAGNOSIS — Z961 Presence of intraocular lens: Secondary | ICD-10-CM | POA: Diagnosis not present

## 2024-01-23 DIAGNOSIS — H26491 Other secondary cataract, right eye: Secondary | ICD-10-CM | POA: Diagnosis not present

## 2024-02-08 DIAGNOSIS — R200 Anesthesia of skin: Secondary | ICD-10-CM | POA: Diagnosis not present

## 2024-02-08 DIAGNOSIS — R569 Unspecified convulsions: Secondary | ICD-10-CM | POA: Diagnosis not present

## 2024-02-08 DIAGNOSIS — R413 Other amnesia: Secondary | ICD-10-CM | POA: Diagnosis not present

## 2024-02-08 DIAGNOSIS — R202 Paresthesia of skin: Secondary | ICD-10-CM | POA: Diagnosis not present

## 2024-02-08 DIAGNOSIS — R4189 Other symptoms and signs involving cognitive functions and awareness: Secondary | ICD-10-CM | POA: Diagnosis not present

## 2024-02-08 DIAGNOSIS — G250 Essential tremor: Secondary | ICD-10-CM | POA: Diagnosis not present

## 2024-02-10 DIAGNOSIS — H26492 Other secondary cataract, left eye: Secondary | ICD-10-CM | POA: Diagnosis not present
